# Patient Record
Sex: Female | Born: 1985
Health system: Southern US, Community
[De-identification: ages and names within clinical notes are randomized; demographics above are authoritative.]

## PROBLEM LIST (undated history)

## (undated) VITALS — BP 154/107 | HR 73 | Temp 98.5°F | Resp 18

## (undated) DIAGNOSIS — O24919 Unspecified diabetes mellitus in pregnancy, unspecified trimester: Secondary | ICD-10-CM

## (undated) DIAGNOSIS — G43909 Migraine, unspecified, not intractable, without status migrainosus: Secondary | ICD-10-CM

## (undated) DIAGNOSIS — F329 Major depressive disorder, single episode, unspecified: Secondary | ICD-10-CM

## (undated) DIAGNOSIS — IMO0002 Reserved for concepts with insufficient information to code with codable children: Secondary | ICD-10-CM

## (undated) DIAGNOSIS — B009 Herpesviral infection, unspecified: Secondary | ICD-10-CM

## (undated) DIAGNOSIS — F419 Anxiety disorder, unspecified: Secondary | ICD-10-CM

## (undated) DIAGNOSIS — F32A Depression, unspecified: Secondary | ICD-10-CM

## (undated) DIAGNOSIS — I493 Ventricular premature depolarization: Secondary | ICD-10-CM

## (undated) DIAGNOSIS — F99 Mental disorder, not otherwise specified: Secondary | ICD-10-CM

## (undated) HISTORY — DX: Unspecified diabetes mellitus in pregnancy, unspecified trimester: O24.919

## (undated) HISTORY — DX: Reserved for concepts with insufficient information to code with codable children: IMO0002

## (undated) HISTORY — DX: Anxiety disorder, unspecified: F41.9

## (undated) HISTORY — DX: Herpesviral infection, unspecified: B00.9

## (undated) HISTORY — PX: MOUTH SURGERY: SHX715

## (undated) HISTORY — PX: NO PAST SURGERIES: SHX2092

## (undated) HISTORY — DX: Depression, unspecified: F32.A

## (undated) HISTORY — DX: Mental disorder, not otherwise specified: F99

## (undated) HISTORY — DX: Major depressive disorder, single episode, unspecified: F32.9

## (undated) HISTORY — PX: CRYOTHERAPY: SHX1416

## (undated) HISTORY — DX: Migraine, unspecified, not intractable, without status migrainosus: G43.909

## (undated) HISTORY — DX: Ventricular premature depolarization: I49.3

---

## 2007-09-23 ENCOUNTER — Ambulatory Visit: Payer: Self-pay | Admitting: Obstetrics & Gynecology

## 2007-09-23 ENCOUNTER — Ambulatory Visit: Payer: Self-pay | Admitting: Family Medicine

## 2007-09-23 ENCOUNTER — Encounter: Payer: Self-pay | Admitting: Obstetrics & Gynecology

## 2007-09-23 DIAGNOSIS — F329 Major depressive disorder, single episode, unspecified: Secondary | ICD-10-CM | POA: Insufficient documentation

## 2007-09-23 DIAGNOSIS — F3289 Other specified depressive episodes: Secondary | ICD-10-CM | POA: Insufficient documentation

## 2007-09-23 DIAGNOSIS — J45909 Unspecified asthma, uncomplicated: Secondary | ICD-10-CM | POA: Insufficient documentation

## 2007-09-23 LAB — CONVERTED CEMR LAB
BUN: 15 mg/dL (ref 6–23)
CO2: 22 meq/L (ref 19–32)
Calcium: 9.4 mg/dL (ref 8.4–10.5)
Chloride: 107 meq/L (ref 96–112)
Cholesterol: 132 mg/dL (ref 0–200)
Creatinine, Ser: 0.69 mg/dL (ref 0.40–1.20)
Glucose, Bld: 91 mg/dL (ref 70–99)
HDL: 48 mg/dL (ref >39)
Total CHOL/HDL Ratio: 2.8

## 2007-09-24 ENCOUNTER — Encounter: Payer: Self-pay | Admitting: Family Medicine

## 2007-10-06 ENCOUNTER — Ambulatory Visit: Payer: Self-pay | Admitting: Family Medicine

## 2007-10-06 DIAGNOSIS — M62838 Other muscle spasm: Secondary | ICD-10-CM

## 2007-10-06 DIAGNOSIS — T753XXA Motion sickness, initial encounter: Secondary | ICD-10-CM

## 2007-10-15 ENCOUNTER — Encounter: Admission: RE | Admit: 2007-10-15 | Discharge: 2007-11-24 | Payer: Self-pay | Admitting: Family Medicine

## 2007-10-28 ENCOUNTER — Ambulatory Visit: Payer: Self-pay | Admitting: Obstetrics & Gynecology

## 2007-11-24 ENCOUNTER — Encounter: Payer: Self-pay | Admitting: Family Medicine

## 2008-01-28 ENCOUNTER — Encounter: Payer: Self-pay | Admitting: Family Medicine

## 2008-01-28 ENCOUNTER — Encounter: Admission: RE | Admit: 2008-01-28 | Discharge: 2008-01-28 | Payer: Self-pay | Admitting: Sports Medicine

## 2008-02-02 ENCOUNTER — Encounter: Admission: RE | Admit: 2008-02-02 | Discharge: 2008-03-09 | Payer: Self-pay | Admitting: Sports Medicine

## 2008-02-11 ENCOUNTER — Encounter: Payer: Self-pay | Admitting: Family Medicine

## 2008-02-13 ENCOUNTER — Encounter: Admission: RE | Admit: 2008-02-13 | Discharge: 2008-02-13 | Payer: Self-pay | Admitting: Sports Medicine

## 2008-04-26 ENCOUNTER — Telehealth (INDEPENDENT_AMBULATORY_CARE_PROVIDER_SITE_OTHER): Payer: Self-pay | Admitting: *Deleted

## 2008-04-27 ENCOUNTER — Ambulatory Visit: Payer: Self-pay | Admitting: Family Medicine

## 2008-04-27 DIAGNOSIS — M279 Disease of jaws, unspecified: Secondary | ICD-10-CM | POA: Insufficient documentation

## 2008-05-18 ENCOUNTER — Ambulatory Visit: Payer: Self-pay | Admitting: Obstetrics & Gynecology

## 2008-05-18 ENCOUNTER — Encounter: Payer: Self-pay | Admitting: Obstetrics & Gynecology

## 2008-06-24 ENCOUNTER — Ambulatory Visit: Payer: Self-pay | Admitting: Physician Assistant

## 2008-06-24 ENCOUNTER — Ambulatory Visit: Payer: Self-pay | Admitting: Occupational Medicine

## 2008-06-24 DIAGNOSIS — R109 Unspecified abdominal pain: Secondary | ICD-10-CM

## 2008-06-24 LAB — CONVERTED CEMR LAB
Bilirubin Urine: NEGATIVE
Blood in Urine, dipstick: NEGATIVE
Chlamydia, DNA Probe: NEGATIVE
GC Probe Amp, Genital: NEGATIVE
Ketones, urine, test strip: NEGATIVE
Nitrite: NEGATIVE
WBC Urine, dipstick: NEGATIVE

## 2008-06-27 ENCOUNTER — Ambulatory Visit: Payer: Self-pay | Admitting: Obstetrics and Gynecology

## 2008-09-13 ENCOUNTER — Telehealth: Payer: Self-pay | Admitting: Family Medicine

## 2008-09-13 ENCOUNTER — Ambulatory Visit: Payer: Self-pay | Admitting: Family Medicine

## 2008-09-23 ENCOUNTER — Telehealth: Payer: Self-pay | Admitting: Family Medicine

## 2008-12-21 ENCOUNTER — Ambulatory Visit: Payer: Self-pay | Admitting: Obstetrics & Gynecology

## 2008-12-21 ENCOUNTER — Encounter: Payer: Self-pay | Admitting: Obstetrics & Gynecology

## 2009-01-03 DIAGNOSIS — IMO0002 Reserved for concepts with insufficient information to code with codable children: Secondary | ICD-10-CM

## 2009-01-03 DIAGNOSIS — R87619 Unspecified abnormal cytological findings in specimens from cervix uteri: Secondary | ICD-10-CM

## 2009-01-03 HISTORY — DX: Reserved for concepts with insufficient information to code with codable children: IMO0002

## 2009-01-03 HISTORY — DX: Unspecified abnormal cytological findings in specimens from cervix uteri: R87.619

## 2009-01-11 ENCOUNTER — Ambulatory Visit: Payer: Self-pay | Admitting: Family Medicine

## 2009-01-11 ENCOUNTER — Ambulatory Visit: Payer: Self-pay | Admitting: Obstetrics & Gynecology

## 2009-01-12 LAB — CONVERTED CEMR LAB
ALT: 12 units/L (ref 0–35)
Albumin: 4.5 g/dL (ref 3.5–5.2)
CO2: 24 meq/L (ref 19–32)
Calcium: 9.1 mg/dL (ref 8.4–10.5)
Chloride: 104 meq/L (ref 96–112)
Cholesterol: 150 mg/dL (ref 0–200)
Creatinine, Ser: 0.69 mg/dL (ref 0.40–1.20)
HCT: 41.3 % (ref 36.0–46.0)
Platelets: 278 10*3/uL (ref 150–400)
Potassium: 4.4 meq/L (ref 3.5–5.3)
TSH: 1.22 microintl units/mL (ref 0.350–4.500)
Total CHOL/HDL Ratio: 2.5
Total Protein: 7.6 g/dL (ref 6.0–8.3)
WBC: 9.1 10*3/uL (ref 4.0–10.5)

## 2009-02-15 ENCOUNTER — Ambulatory Visit: Payer: Self-pay | Admitting: Obstetrics and Gynecology

## 2009-02-20 ENCOUNTER — Ambulatory Visit: Payer: Self-pay | Admitting: Family

## 2009-02-20 LAB — CONVERTED CEMR LAB
Chlamydia, DNA Probe: NEGATIVE
GC Probe Amp, Genital: NEGATIVE
HCV Ab: NEGATIVE
Hepatitis B Surface Ag: NEGATIVE

## 2010-03-07 ENCOUNTER — Emergency Department (HOSPITAL_BASED_OUTPATIENT_CLINIC_OR_DEPARTMENT_OTHER): Admission: EM | Admit: 2010-03-07 | Discharge: 2010-03-07 | Payer: Self-pay | Admitting: Physician Assistant

## 2010-03-20 ENCOUNTER — Ambulatory Visit: Payer: Self-pay | Admitting: Obstetrics & Gynecology

## 2010-03-20 ENCOUNTER — Ambulatory Visit: Payer: Self-pay | Admitting: Family Medicine

## 2010-03-21 ENCOUNTER — Encounter: Payer: Self-pay | Admitting: Obstetrics & Gynecology

## 2010-03-21 LAB — CONVERTED CEMR LAB
HCV Ab: NEGATIVE
Trich, Wet Prep: NONE SEEN
Yeast Wet Prep HPF POC: NONE SEEN

## 2010-03-29 ENCOUNTER — Encounter: Payer: Self-pay | Admitting: Family Medicine

## 2010-04-10 ENCOUNTER — Telehealth: Payer: Self-pay | Admitting: Family Medicine

## 2010-04-26 ENCOUNTER — Encounter: Payer: Self-pay | Admitting: Family Medicine

## 2010-07-19 ENCOUNTER — Encounter: Payer: Self-pay | Admitting: Family Medicine

## 2010-07-20 ENCOUNTER — Encounter: Payer: Self-pay | Admitting: Family

## 2010-07-20 ENCOUNTER — Encounter: Payer: Self-pay | Admitting: Obstetrics & Gynecology

## 2010-07-20 ENCOUNTER — Ambulatory Visit: Payer: Self-pay | Admitting: Family

## 2010-07-20 LAB — CONVERTED CEMR LAB
Chlamydia, DNA Probe: NEGATIVE
GC Probe Amp, Genital: NEGATIVE

## 2010-08-27 ENCOUNTER — Encounter: Payer: Self-pay | Admitting: Family Medicine

## 2010-08-28 ENCOUNTER — Ambulatory Visit
Admission: RE | Admit: 2010-08-28 | Discharge: 2010-08-28 | Payer: Self-pay | Source: Home / Self Care | Attending: Family Medicine | Admitting: Family Medicine

## 2010-08-28 ENCOUNTER — Encounter: Payer: Self-pay | Admitting: Family Medicine

## 2010-08-28 DIAGNOSIS — J189 Pneumonia, unspecified organism: Secondary | ICD-10-CM | POA: Insufficient documentation

## 2010-09-04 NOTE — Letter (Signed)
Summary: [facility]  [facility]   Imported By: Lanelle Bal 05/25/2010 15:17:43  _____________________________________________________________________  External Attachment:    Type:   Image     Comment:   External Document

## 2010-09-04 NOTE — Assessment & Plan Note (Signed)
Summary: CPE   Vital Signs:  Patient profile:   25 year old female Height:      62.25 inches Weight:      151 pounds BMI:     27.50 Pulse rate:   87 / minute BP sitting:   118 / 77  (left arm)  Vitals Entered By: Avon Gully CMA, Duncan Dull) (March 20, 2010 1:36 PM) CC: CPE no pap   Primary Care Provider:  Linford Arnold, C  CC:  CPE no pap.  History of Present Illness: CPE no pap.  No psecific complaints today. She is seeing Dr. Margaretha Sheffield for her back pain.    Current Medications (verified): 1)  Multivitamins   Tabs (Multiple Vitamin) .... Take 1 Tablet By Mouth Once A Day 2)  Proair Hfa 108 (90 Base) Mcg/act  Aers (Albuterol Sulfate) .... 2 Puffs Inh As Needed 3)  Mirena 20 Mcg/24hr Iud (Levonorgestrel) .Marland Kitchen.. 1 Tab By Mouth Once Daily 4)  Flovent Hfa 220 Mcg/act Aero (Fluticasone Propionate  Hfa) .... 2 Puff Inhaled Two Times A Day 5)  Nebulizer With Tubing. .... Dx of Asthma. 6)  Albuterol Sulfate (2.5 Mg/58ml) 0.083% Nebu (Albuterol Sulfate) .... 3 Ml in Nebulizer Machine Q 4 Hrs Prn  Allergies (verified): 1)  ! Pcn  Comments:  Nurse/Medical Assistant: The patient's medications and allergies were reviewed with the patient and were updated in the Medication and Allergy Lists. Avon Gully CMA, Duncan Dull) (March 20, 2010 1:37 PM)  Past History:  Past Medical History: Last updated: 09/23/2007 Gestational DM Gyn- [facility].   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  Separated from TimGentry with one child, daughter.  Never Smoked Alcohol use-yes Drug use-no Regular exercise-yes  Review of Systems  The patient denies anorexia, fever, weight loss, weight gain, vision loss, decreased hearing, chest pain, syncope, dyspnea on exertion, peripheral edema, prolonged  cough, headaches, hemoptysis, abdominal pain, melena, hematochezia, severe indigestion/heartburn, hematuria, incontinence, genital sores, muscle weakness, suspicious skin lesions, transient blindness, difficulty walking, depression, unusual weight change, abnormal bleeding, enlarged lymph nodes, and breast masses.    Physical Exam  General:  Well-developed,well-nourished,in no acute distress; alert,appropriate and cooperative throughout examination Head:  Normocephalic and atraumatic without obvious abnormalities. No apparent alopecia or balding. Eyes:  No corneal or conjunctival inflammation noted. EOMI. Perrla.  Ears:  External ear exam shows no significant lesions or deformities.  Otoscopic examination reveals clear canals, tympanic membranes are intact bilaterally without bulging, retraction, inflammation or discharge. Hearing is grossly normal bilaterally. Nose:  External nasal examination shows no deformity or inflammation. Nasal mucosa are pink and moist without lesions or exudates. Mouth:  Oral mucosa and oropharynx without lesions or exudates.  Teeth in good repair. Neck:  No deformities, masses, or tenderness noted. Chest Wall:  No deformities, masses, or tenderness noted. Breasts:  No mass, nodules, thickening, tenderness, bulging, retraction, inflamation, nipple discharge or skin changes noted.   Lungs:  Normal respiratory effort, chest expands symmetrically. Lungs are clear to auscultation, no crackles or wheezes. Heart:  Normal rate and regular rhythm. S1 and S2 normal without gallop, murmur, click, rub or other extra sounds. Abdomen:  Bowel sounds positive,abdomen soft and non-tender without masses, organomegaly or hernias noted. Msk:  No deformity or scoliosis noted of thoracic or lumbar spine.   Pulses:  R and  L carotid,radialdorsalis pedis and posterior tibial pulses are full and equal bilaterally Extremities:  No clubbing, cyanosis, edema, or deformity noted with normal full  range of motion of all joints.   Neurologic:  No cranial nerve deficits noted. Station and gait are normal. DTRs are symmetrical throughout. Sensory, motor and coordinative functions appear intact. Skin:  no rashes.   Cervical Nodes:  No lymphadenopathy noted Psych:  Cognition and judgment appear intact. Alert and cooperative with normal attention span and concentration. No apparent delusions, illusions, hallucinations   Impression & Recommendations:  Problem # 1:  HEALTH MAINTENANCE EXAM (ICD-V70.0) Will get screening labs Doing well.  Orders: T-Comprehensive Metabolic Panel [id number]) T-Lipid Profile [id number])  Complete Medication List: 1)  Multivitamins Tabs (Multiple vitamin) .... Take 1 tablet by mouth once a day 2)  Proair Hfa 108 (90 Base) Mcg/act Aers (Albuterol sulfate) .... 2 puffs inh as needed 3)  Mirena 20 Mcg/24hr Iud (Levonorgestrel) .Marland Kitchen.. 1 tab by mouth once daily 4)  Flovent Hfa 220 Mcg/act Aero (Fluticasone propionate  hfa) .... 2 puff inhaled two times a day 5)  Nebulizer With Tubing.  .... Dx of asthma. 6)  Albuterol Sulfate (2.5 Mg/69ml) 0.083% Nebu (Albuterol sulfate) .... 3 ml in nebulizer machine q 4 hrs prn  Patient Instructions: 1)  We will call you with your lab results.  2)  Keep up the good work with the exercise.  3)  Will start effexor and see you back in one month.  Prescriptions: ALBUTEROL SULFATE (2.5 MG/3ML) 0.083% NEBU (ALBUTEROL SULFATE) 3 ml in nebulizer machine q 4 hrs prn  #2 boxes x 1   Entered and Authorized by:   Nani Gasser MD   Signed by:   Nani Gasser MD on 03/20/2010   Method used:   Electronically to        [facility]* (retail)       [address] [place], [place]  [address]       Ph: [phone]       Fax: [phone]   RxID:   [id number] FLOVENT HFA 220 MCG/ACT AERO (FLUTICASONE PROPIONATE  HFA) 2 puff inhaled two times a day  #1 x 5   Entered and Authorized by:    Nani Gasser MD   Signed by:   Nani Gasser MD on 03/20/2010   Method used:   Electronically to        [facility]* (retail)       [address] [place], [place]  [address]       Ph: [phone]       Fax: [phone]   RxID:   [id number] PROAIR HFA 108 (90 BASE) MCG/ACT  AERS (ALBUTEROL SULFATE) 2 puffs INH as needed  #1 x 2   Entered and Authorized by:   Nani Gasser MD   Signed by:   Nani Gasser MD on 03/20/2010   Method used:   Electronically to        [facility]* (retail)       [address] [place], [place]  [address]       Ph: [phone]       Fax: [phone]   RxID:   [id number]

## 2010-09-04 NOTE — Progress Notes (Signed)
Summary: med  Phone Note Call from Patient   Caller: Patient Call For: Nani Gasser MD Summary of Call: Pt called and states when she was last here she was supposed to get a rx for effexor but it was not at the [facility] in [place]. pt wants a rx sent to [facility] on [address] Initial call taken by: Avon Gully CMA, Duncan Dull),  April 10, 2010 10:47 AM  Follow-up for Phone Call        Rx sent.  Follow-up by: Nani Gasser MD,  April 10, 2010 10:52 AM    New/Updated Medications: VENLAFAXINE HCL 37.5 MG XR24H-CAP (VENLAFAXINE HCL) Take 1 tablet by mouth once a day Prescriptions: VENLAFAXINE HCL 37.5 MG XR24H-CAP (VENLAFAXINE HCL) Take 1 tablet by mouth once a day  #30 x 0   Entered and Authorized by:   Nani Gasser MD   Signed by:   Nani Gasser MD on 04/10/2010   Method used:   Electronically to        [facility] [phone]* (retail)       [address]       [place], [place]  [address]       Ph: [phone]       Fax: [phone]   RxID:   [id number]

## 2010-09-04 NOTE — Letter (Signed)
Summary: [facility]  [facility]   Imported By: Lanelle Bal 04/19/2010 11:37:24  _____________________________________________________________________  External Attachment:    Type:   Image     Comment:   External Document

## 2010-09-06 NOTE — Letter (Signed)
Summary: [facility]  [facility]   Imported By: Lanelle Bal 08/29/2010 10:10:21  _____________________________________________________________________  External Attachment:    Type:   Image     Comment:   External Document

## 2010-09-06 NOTE — Assessment & Plan Note (Signed)
Summary: Pneumonia   Vital Signs:  Patient profile:   25 year old female Height:      62.25 inches Weight:      165 pounds Pulse rate:   69 / minute BP sitting:   136 / 81  (right arm) Cuff size:   regular  Vitals Entered By: Avon Gully CMA, Duncan Dull) (August 28, 2010 4:18 PM) CC: hosp f/u   Primary Care Provider:  Linford Arnold, C  CC:  hosp f/u.  History of Present Illness: 4 days ago felt some tightness in her chest and thought it was her asthma. Used her alubertol but it didnb't really seem to help. Has been very fatigued for over a week. Sleeping alot. Then woke up yesteray and felt worse. Went to [facility].  Given CXR and told had early PNA. Given reocephin shot and given rx for zithromax.  New rx for the albuterol given as well. Note she had been out of her flovent because she couldn't afford it. Was over $100. No fever or sinus sxs.   We called to get records but were unable to get any before she left.  Also she did bring in her CXR report which they printed for her. CXr report showed mild righ lower lobe infiltrates suggesting early penumonia.    Current Medications (verified): 1)  Multivitamins   Tabs (Multiple Vitamin) .... Take 1 Tablet By Mouth Once A Day 2)  Proair Hfa 108 (90 Base) Mcg/act  Aers (Albuterol Sulfate) .... 2 Puffs Inh As Needed 3)  Mirena 20 Mcg/24hr Iud (Levonorgestrel) .Marland Kitchen.. 1 Tab By Mouth Once Daily 4)  Flovent Hfa 220 Mcg/act Aero (Fluticasone Propionate  Hfa) .... 2 Puff Inhaled Two Times A Day 5)  Nebulizer With Tubing. .... Dx of Asthma. 6)  Albuterol Sulfate (2.5 Mg/69ml) 0.083% Nebu (Albuterol Sulfate) .... 3 Ml in Nebulizer Machine Q 4 Hrs Prn 7)  Amitriptyline Hcl 75 Mg Tabs (Amitriptyline Hcl) 8)  Soma 350 Mg Tabs (Carisoprodol) 9)  Norco 10-325 Mg Tabs (Hydrocodone-Acetaminophen)  Allergies (verified): 1)  ! Pcn  Comments:  Nurse/Medical Assistant: The patient's medications and allergies were reviewed with the patient and were updated in  the Medication and Allergy Lists. 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  Separated from TimGentry with one child, daughter.  Never Smoked Alcohol use-yes Drug use-no Regular exercise-yes  Physical Exam  General:  Well-developed,well-nourished,in no acute distress; alert,appropriate and cooperative throughout examination Head:  Normocephalic and atraumatic without obvious abnormalities. No apparent alopecia or balding. Eyes:  No corneal or conjunctival inflammation noted. EOMI. Perrla.  Ears:  External ear exam shows no significant lesions or deformities.  Otoscopic examination reveals clear canals, tympanic membranes are intact bilaterally without bulging, retraction, inflammation or discharge. Hearing is grossly normal bilaterally. Nose:  External nasal examination shows no deformity or inflammation. Nasal mucosa are pink and moist without lesions or exudates. Mouth:  Oral mucosa and oropharynx without lesions or exudates.  Teeth in good repair. Neck:  No deformities, masses, or tenderness noted. Lungs:  Normal respiratory effort, chest expands symmetrically. Lungs are clear to auscultation, no crackles or wheezes. Heart:  Normal rate and regular rhythm. S1 and S2 normal without gallop, murmur, click, rub or other extra sounds. Skin:  no rashes.   Cervical Nodes:  No lymphadenopathy noted Psych:  Cognition and judgment appear intact. Alert and cooperative with normal attention span and  concentration. No apparent delusions, illusions, hallucinations   Impression & Recommendations:  Problem # 1:  PNEUMONIA (ICD-486) Asked her to fill her ABX today and gets started as soon as possible.  Gave her samples of QVAR 80mg  one puff two times a day. Follow up in 2 weeks to make sure she is better and sxs have resolved.  Held off on home oral steroids.   Call if  suddenly gets  Wrok note given.   Complete Medication List: 1)  Multivitamins Tabs (Multiple vitamin) .... Take 1 tablet by mouth once a day 2)  Proair Hfa 108 (90 Base) Mcg/act Aers (Albuterol sulfate) .... 2 puffs inh as needed 3)  Mirena 20 Mcg/24hr Iud (Levonorgestrel) .Marland Kitchen.. 1 tab by mouth once daily 4)  Flovent Hfa 220 Mcg/act Aero (Fluticasone propionate  hfa) .... 2 puff inhaled two times a day 5)  Nebulizer With Tubing.  .... Dx of asthma. 6)  Albuterol Sulfate (2.5 Mg/1ml) 0.083% Nebu (Albuterol sulfate) .... 3 ml in nebulizer machine q 4 hrs prn 7)  Amitriptyline Hcl 75 Mg Tabs (Amitriptyline hcl) 8)  Soma 350 Mg Tabs (Carisoprodol) 9)  Norco 10-325 Mg Tabs (Hydrocodone-acetaminophen)   Orders Added: 1)  Est. Patient Level IV [41660]

## 2010-09-06 NOTE — Letter (Signed)
Summary: Out of Work  [facility]  [address]   [place], [place] [address]   Phone: [phone]  Fax: [phone]    August 28, 2010   Employee:  [employer]AK[employer]    To Whom It May Concern:   For Medical reasons, please excuse the above named employee from work for the following dates:  Start:   08-27-2010  Return:   08-30-2010  If you need additional information, please feel free to contact our office.         Sincerely,    Nani Gasser MD

## 2010-09-13 ENCOUNTER — Encounter: Payer: Self-pay | Admitting: Family Medicine

## 2010-10-23 NOTE — Letter (Signed)
Summary: [facility]  [facility]   Imported By: Maryln Gottron 10/16/2010 15:58:48  _____________________________________________________________________  External Attachment:    Type:   Image     Comment:   External Document

## 2010-11-06 ENCOUNTER — Other Ambulatory Visit: Payer: Self-pay | Admitting: Obstetrics & Gynecology

## 2010-11-11 LAB — POCT PREGNANCY, URINE: Preg Test, Ur: NEGATIVE

## 2010-11-13 ENCOUNTER — Other Ambulatory Visit: Payer: Self-pay | Admitting: Obstetrics & Gynecology

## 2010-11-13 ENCOUNTER — Other Ambulatory Visit: Payer: BLUE CROSS/BLUE SHIELD | Admitting: Obstetrics & Gynecology

## 2010-11-13 DIAGNOSIS — D069 Carcinoma in situ of cervix, unspecified: Secondary | ICD-10-CM

## 2010-12-18 NOTE — Assessment & Plan Note (Signed)
Shirley Ruiz, Shirley Ruiz               ACCOUNT NO.:  [id number]   MEDICAL RECORD NO.:  [id number]          PATIENT TYPE:  POB   LOCATION:  [facility] [place]         FACILITY:  [facility]   PHYSICIAN:  Caren Griffins, CNM       DATE OF BIRTH:  [birth year]   DATE OF SERVICE:  06/27/2008                                  CLINIC NOTE   REASON FOR VISIT:  Recheck of lower abdominal pain.   HISTORY:  This 25 year old G1, P1, whose periods are irregular and scant  on Mirena.  She was seen here 4 days ago; however, the office record is  not available.  She describes having severe suprapubic pubic abdominal  pain of short duration.  She also had had been having some diarrhea for  a couple of days prior to that visit.  She denies purulent vaginal  discharge, dysuria, bowel problems, or in fact any abdominal pain since  we last saw her.  She did start on Vicoprofen, azithromycin, and doxy.  She is still taking the doxy and is not taking the others.  She denies  dyspareunia, but has not had intercourse with deep penetration since her  pain began about a week ago.  She is happy with her Mirena and she is  aware that she is due for a Pap in 6 months which would be in April due  to having LSIL on previous Pap.   ASSESSMENT:  Pelvic pain, possibly pelvic inflammatory disease, resolved  on treatment.   PLAN:  The patient is reassured that her gonorrhea and chlamydia  cultures were negative.  Her urinalysis was essentially negative with a  pH of 8.5 and 30 of protein, however.  She is advised to call us if she  has any further episodes of severe abdominal pain and also to note its  association with activity and intercourse, bowel and bladder function.           ______________________________  Caren Griffins, CNM     DP/MEDQ  D:  06/27/2008  T:  06/28/2008  Job:  [id number]

## 2010-12-18 NOTE — Assessment & Plan Note (Signed)
NAMEJONAYA, FRESHOUR NO.:  [id number]   MEDICAL RECORD NO.:  [id number]          PATIENT TYPE:  POB   LOCATION:  [facility] [place]         FACILITY:  [facility]   PHYSICIAN:  Elsie Lincoln, MD      DATE OF BIRTH:  07/27/1986   DATE OF SERVICE:  12/21/2008                                  CLINIC NOTE   The patient is a 25 year old female who presents for Pap smear.  The  patient has a history of 2 low-grades with the colpo showing CIN-1.  Also of note, the patient is recently separated from her husband.  She  has not had sex with anyone else; however, she is considering entering  into a relationship.  She has good contraception with Mirena, but she is  in need of Gardasil vaccination, and we explained the differences in the  high-risk viruses and that we cannot tell which one she has been  affected by; however, it could protect her from 16, 18 as well as the  wart viruses.  The patient agrees to have the Gardasil shot, and we will  start that today.  The patient has no other complaints.   PHYSICAL EXAMINATION:  ABDOMEN:  Soft, nontender.  GENITALIA:  Tanner V.  Vagina pink, normal rugae.  Cervix closed with  IUD string approximately 3 cm out of the os.   ASSESSMENT AND PLAN:  A 25 year old female with low-grade squamous  intraepithelial lesion Pap smear.  1. Re-Pap today.  If the Pap smear shows persistent low grade, I would      like to repeat the colposcopy and make sure that there are no      advancement.  2. Gardasil vaccination at 0, 2, and 6 months.  3. Return to clinic for the Gardasil or sooner for colpo if necessary.           ______________________________  Elsie Lincoln, MD     KL/MEDQ  D:  12/21/2008  T:  12/22/2008  Job:  [id number]

## 2010-12-18 NOTE — Assessment & Plan Note (Signed)
NAMEMYRACLE, FEBRES NO.:  [id number]   MEDICAL RECORD NO.:  [id number]          PATIENT TYPE:  POB   LOCATION:  [facility] [place]         FACILITY:  [facility]   PHYSICIAN:  Elsie Lincoln, MD      DATE OF BIRTH:  01-18-1986   DATE OF SERVICE:                                  CLINIC NOTE   The patient is a 25 year old G1, P27 female LMP 08/07/2007.  The patient  uses Purnell Shoemaker for contraception and is happy with it. The patient is here  for her early exam, however, she did have a full physical by family  practice earlier today. They did not do the breast and Pap smear, which  I will do myself. She has no complaints of an obstetrical or  gynecological nature. She is thinking about conceiving in the next year.  If that is the case, she should start prenatal vitamins one month prior  to starting to conceive and come for an IUD removal.   PAST MEDICAL HISTORY:  Depression, asthma, stress eating and gestational  diabetes.   PAST SURGICAL HISTORY:  None.   GYN HISTORY:  Had normal Pap smears approximately four years ago and all  was done with repeat Pap smears, which were normal. Never had any  ovarian cysts, fibroid tumors or sexually transmitted diseases. She is  sexually active with her husband with no complaints.   FAMILY HISTORY:  Is positive for heart disease in her  mother and mother  has high blood pressure.  Her grandmother and mother also have chronic  depression.   SOCIAL HISTORY:  She lives with her husband and daughter. She does work  outside the home. She does not smoke. She does drink approximately one  to two alcoholic drinks per month and she does drink one caffeinated  beverage a day.   SYSTEMS REVIEW:  Is positive for muscle aches and fatigue. 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   PHYSICAL EXAMINATION:  VITAL SIGNS: Pulse 88. Blood pressure 121/81.  Weight 135. Height 62  and a fourth inches.  GENERAL: Well-nourished and well-developed in no apparent distress.  HEENT: Normocephalic atraumatic.  BREASTS: No masses, nontender, no lymphadenopathy.  ABDOMEN: Soft, nontender. No organomegaly. No hernia.  GENITALIA: Tanner five. Vaginal pink. Normal rugae. Cervix closed,  nontender. The uterus anteverted, nontender. Adnexa no masses,  nontender.. The urethra and bladder nontender. No evidence of rectocele  or cystocele.  EXTREMITIES: Nontender.   ASSESSMENT/PLAN:  25 year old female for a Pap and pelvic today.  1. Pap smear done.  2. GC and Chlamydia cultures sent.  3. Previous sexual counseling done.  4. Return to clinic in a year or sooner as needed.           ______________________________  Elsie Lincoln, MD     KL/MEDQ  D:  09/23/2007  T:  09/24/2007  Job:  [id number]

## 2010-12-18 NOTE — Assessment & Plan Note (Signed)
NAMEJAKEIA, CARRERAS               ACCOUNT NO.:  [id number]   MEDICAL RECORD NO.:  [id number]          PATIENT TYPE:  POB   LOCATION:  [facility] [place]         FACILITY:  [facility]   PHYSICIAN:  Sid Falcon, CNM  DATE OF BIRTH:  [birth year]   DATE OF SERVICE:  02/20/2009                                  CLINIC NOTE   REASON FOR VISIT:  Second dose of Gardasil and STD screening.   The patient is here with reports of separated from husband who she  believes is having sex with other woman.  Denies any abnormal discharge  or complaints; however, would like screening before becoming sexually  active again.   PHYSICAL EXAMINATION:  Cervix healing cryo sites, white clear discharge  seen.  Yellow mucus seen around the cryo sites.  IUD strings visualized.  Negative cervical motion tenderness.   ASSESSMENT:  1. Sexually transmitted disease screening.  2. Gardasil vaccination.   PLAN:  Gardasil second dose today.   LABORATORY DATA:  Hepatitis B and hepatitis C, HIV screen, and RPR with  Chlamydia and gonorrhea obtained during the pelvic exam.  The patient is  to follow up in 6 months after the cryo for repeat Pap.      Sid Falcon, CNM     WM/MEDQ  D:  02/20/2009  T:  02/21/2009  Job:  [id number]

## 2010-12-18 NOTE — Assessment & Plan Note (Signed)
NAMEKAMRIE, FANTON               ACCOUNT NO.:  [id number]   MEDICAL RECORD NO.:  [id number]          PATIENT TYPE:  POB   LOCATION:  [facility] [place]         [facility]ACILITY[facility]   PHYSICIAN:  Sid Falcon, CNM  DATE OF BIRTH:  1986-07-14   DATE OF SERVICE:  07/20/2010                                  CLINIC NOTE   REASON FOR VISIT:  Vaginal irritation.   The patient is reporting some odor and pain with urinating for the past  week, concerned because partner had sex with some other 2 months ago  after her last visit with [place].  No fever, body aches, or chills.   PHYSICAL EXAMINATION:  PELVIC:  No abnormal lesions.  White frothy  discharge noted in the vaginal wall.  No odor.  No abnormal lesions.  Negative cervical motion tenderness.  Cervix with no abnormal lesions.  No abnormal discharge coming from os.   ASSESSMENT:  1. Vaginal discharge.   PLAN:  Lab, wet prep, and GC/CT sent to lab.  Prescription for  metronidazole 500 mg 1 p.o. b.i.d. x7 days, use only if call with a  positive result.  The patient reminded about her repeat Pap smear due in  February 2012.      Sid Falcon, CNM     WM/MEDQ  D:  07/20/2010  T:  07/21/2010  Job:  [id number]

## 2010-12-18 NOTE — Assessment & Plan Note (Signed)
NAMEVELVA, MOLINARI               ACCOUNT NO.:  [id number]   MEDICAL RECORD NO.:  [id number]          PATIENT TYPE:  POB   LOCATION:  [facility] at [place]         FACILITY:  [facility]   PHYSICIAN:  Maylon Cos, CNM    DATE OF BIRTH:  Feb 03, 1986   DATE OF SERVICE:  06/24/2008                                  CLINIC NOTE   The patient presented at this visit for a complaint of suprapubic pain  x3 days.  The patient has a Mirena IUD and has had one for approximately  2-1/2 years.  She states that 3 days ago she started having some  suprapubic pressure and pain that was constant and excruciating and that  she had had an episode of diarrhea approximately 2 days ago after the  pain started.  During this course of time she has been afebrile but has  run a low-grade temperature.  The last temperature recorded the evening  of the night before her visit, being 99.2.  She states that the pain is  not well relieved by Tylenol or ibuprofen.  She has had no unusual  vaginal discharge, no vomiting, no chills.  Her vital signs today are  stable.   On examination today Nicki is a well-nourished 25 year old female who  appears to be her stated age.  She is in no apparent distress.  Her exam  today is problem focused.  ABDOMINAL EXAM:  Her abdomen is nontender.  She has positive bowel  sounds in 4 quadrants.  There are no masses noted.  On genitalia exam  she is a Tanner 5.  There are no lesions externally.  Mucous membranes  are pink without lesion.  Her cervix is parous and her IUD strings are  noted at the cervical os.  There is a scant amount of creamy vaginal  discharge noted in the vault.  However, there is no purulent discharge  noted coming from the cervical os and her cervix is nonfriable.  Cultures and wet preps are obtained.  On bimanual examination there is  mild cervical motion tenderness but no chandelier sign.  Her uterus is  also nonenlarged but slightly tender.  Adnexa are nonenlarged  and  nontender.   IMPRESSION:  Abdominal pain suspicious for mild pelvic inflammatory  disease.   IMPRESSION:  Gonorrhea, chlamydia cultures were obtained and a urine  culture was sent.  The patient was started on doxycycline 100 mg daily  and instructed to follow up in 48 hours for recheck.  She was also given  pain medication of Vicoprofen with instructions of 1 every 6 hours  p.r.n. as needed and the patient was given my pager number in case the  pain became more excruciating and hospital evaluation was needed.  Overall, the patient is very stable and appropriate for outpatient  management if this is a mild PID.           ______________________________  Maylon Cos, CNM     SS/MEDQ  D:  09/16/2008  T:  09/16/2008  Job:  [id number]

## 2010-12-18 NOTE — Assessment & Plan Note (Signed)
NAME:  Shirley Ruiz, Shirley Ruiz NO.:  [id number]   MEDICAL RECORD NO.:  [id number]          PATIENT TYPE:  POB   LOCATION:  [facility] at [place]         FACILITY:  [facility]   PHYSICIAN:  Elsie Lincoln, MD      DATE OF BIRTH:  [birth year]   DATE OF SERVICE:  03/20/2010                                  CLINIC NOTE   The patient is a 25 year old para 1 female who presents for yearly exam.  The patient has a history of CIN II status post cryo a year ago.  She  missed her first 94-month followup due to lack of insurance coverage.  She also missed Gardasil #3, but she is getting today.  The patient is  in a new relationship and would like STD testing.  She has no new  medical problems.  She has new onset back pain and sees Dr. Margaretha Sheffield and  Dr. Linford Arnold for this.  She has a Mirena IUD in place and has irregular  spotting, but no menstrual cycle.  She is happy with the Mirena.  It is  due to come out in a year.   PAST MEDICAL HISTORY:  History of depression, asthma, and gestational  diabetes.   PAST SURGICAL HISTORY:  None.   OBSTETRICAL HISTORY:  NSVD x1.   GYNECOLOGIC HISTORY:  CIN II requiring cryo, IUD in situ.  The patient  has a history of probable pelvic inflammatory disease and STDs in the  past, none currently hopefully.   FAMILY HISTORY:  Chronic depression in a grandmother and mother.  High  blood pressure in her mother and heart disease in her mother.   MEDICATIONS:  Albuterol, multivitamin, Flovent and she will be starting  on Effexor as her doctor just called this in for her.   SOCIAL HISTORY:  The patient does not smoke or do drugs.  She drinks  alcohol socially.  She has never been a victim of sexual or physical  abuse.   Systemic review is positive for back pain.   PHYSICAL EXAMINATION:  VITAL SIGNS:  Pulse 64, blood pressure 129/85,  weight 149, height 62 inches.  GENERAL:  Well nourished, well developed, no apparent distress.  HEENT:  Normocephalic,  atraumatic.  NECK:  Thyroid, no masses.  LUNGS:  Clear to auscultation bilaterally.  HEART:  Regular rate and rhythm.  BREASTS:  No masses.  No nipple discharge.  The patient does have 2  hickeys on her breast.  No lymphadenopathy.  ABDOMEN:  Soft, nontender.  No organomegaly.  No hernia.  PELVIC:  Genitalia; shaven, Tanner V.  Vagina pink.  Normal rugae.  Cervix is closed, nontender.  Uterus; anteverted, nontender.  Adnexa; no  masses, nontender.  No cystocele.  No rectocele.  EXTREMITIES:  No edema.   ASSESSMENT AND PLAN:  A 25 year old female for well-woman exam.  1. Pap smear.  The patient needs a Pap smear in 6 month because she      has had cervical intraepithelial neoplasia II.  2. The patient needs third shot of Gardasil.  3. Wet prep, GC, Chlamydia, hep B, hep C, RPR and HIV testing.  4. Return to clinic in 6 months  ______________________________  Elsie Lincoln, MD     KL/MEDQ  D:  03/20/2010  T:  03/21/2010  Job:  [id number]

## 2011-01-16 ENCOUNTER — Other Ambulatory Visit: Payer: Self-pay | Admitting: Specialist

## 2011-01-16 DIAGNOSIS — M5412 Radiculopathy, cervical region: Secondary | ICD-10-CM

## 2011-01-26 ENCOUNTER — Ambulatory Visit
Admission: RE | Admit: 2011-01-26 | Discharge: 2011-01-26 | Disposition: A | Discharge status: Home / Self Care | Payer: BLUE CROSS/BLUE SHIELD | Source: Ambulatory Visit | Attending: Specialist | Admitting: Specialist

## 2011-01-26 ENCOUNTER — Other Ambulatory Visit: Payer: BLUE CROSS/BLUE SHIELD

## 2011-01-26 DIAGNOSIS — M5412 Radiculopathy, cervical region: Secondary | ICD-10-CM

## 2011-02-13 ENCOUNTER — Other Ambulatory Visit: Payer: Self-pay | Admitting: Specialist

## 2011-02-13 DIAGNOSIS — M5410 Radiculopathy, site unspecified: Secondary | ICD-10-CM

## 2011-02-18 ENCOUNTER — Ambulatory Visit (INDEPENDENT_AMBULATORY_CARE_PROVIDER_SITE_OTHER): Payer: BLUE CROSS/BLUE SHIELD | Admitting: Family Medicine

## 2011-02-18 ENCOUNTER — Encounter: Payer: Self-pay | Admitting: Family Medicine

## 2011-02-18 VITALS — BP 135/92 | HR 104 | Ht 62.0 in | Wt 164.0 lb

## 2011-02-18 DIAGNOSIS — J189 Pneumonia, unspecified organism: Secondary | ICD-10-CM

## 2011-02-18 NOTE — Progress Notes (Signed)
  Subjective:    Patient ID: Shirley Ruiz, female    DOB: 1986/06/09, 25 y.o.   MRN: [id number]  HPI Started with Right ST on Friday night. Then next day bilat ST, then felt achey, chills, maybe fever?Marland Kitchen Took Tylenol and fever was still 102.4. Then fever to 102.7 so took IBU and then fever kept rising.  Then went to [facility].  Also has a pressure in her chest.  Used her inhaler.  Has PNA earlier this year.  Told had a early left sided PNA. Given rocephin IM.  D-dimer also elevated adn then had a normal a chest CT scan. Started on Avelox and then given rx for home.  Then given tessalon perles.  Started Avelex yesterday. Last fever was last night around 100.  100.5 this am.  Took IBU.  Still SOB.  Has some pressure in her ears.    Review of Systems     Objective:   Physical Exam  Constitutional: She is oriented to person, place, and time. She appears well-developed and well-nourished.  HENT:  Head: Normocephalic and atraumatic.  Cardiovascular: Normal rate, regular rhythm and normal heart sounds.   Pulmonary/Chest: Effort normal and breath sounds normal. No respiratory distress. She has no wheezes.  Neurological: She is alert and oriented to person, place, and time.  Skin: Skin is warm and dry.  Psychiatric: She has a normal mood and affect.          Assessment & Plan:  F.u PNA form hospital. On Avelox. Still temp earlier today but she is doing better.  Overall trending down. Out of work for 2 more day. Call if not better in one week. Use inhaler prn.  Call if SOB gets worse.

## 2011-02-18 NOTE — Patient Instructions (Signed)
Call if not better in one week Complete your antibiotic.

## 2011-02-23 ENCOUNTER — Ambulatory Visit
Admission: RE | Admit: 2011-02-23 | Discharge: 2011-02-23 | Disposition: A | Discharge status: Home / Self Care | Payer: BLUE CROSS/BLUE SHIELD | Source: Ambulatory Visit | Attending: Specialist | Admitting: Specialist

## 2011-02-23 DIAGNOSIS — M5410 Radiculopathy, site unspecified: Secondary | ICD-10-CM

## 2011-03-14 ENCOUNTER — Encounter: Payer: Self-pay | Admitting: *Deleted

## 2011-03-27 ENCOUNTER — Ambulatory Visit (INDEPENDENT_AMBULATORY_CARE_PROVIDER_SITE_OTHER): Payer: BLUE CROSS/BLUE SHIELD | Admitting: Family Medicine

## 2011-03-27 ENCOUNTER — Encounter: Payer: Self-pay | Admitting: Family Medicine

## 2011-03-27 VITALS — BP 140/83 | HR 78 | Wt 156.0 lb

## 2011-03-27 DIAGNOSIS — N760 Acute vaginitis: Secondary | ICD-10-CM

## 2011-03-27 DIAGNOSIS — Z23 Encounter for immunization: Secondary | ICD-10-CM

## 2011-03-27 LAB — POCT URINALYSIS DIPSTICK
Glucose, UA: NEGATIVE
Ketones, UA: NEGATIVE
Spec Grav, UA: 1.025
Urobilinogen, UA: 0.2

## 2011-03-27 LAB — WET PREP FOR TRICH, YEAST, CLUE: Trich, Wet Prep: NONE SEEN

## 2011-03-27 MED ORDER — SULFAMETHOXAZOLE-TMP DS 800-160 MG PO TABS: 1.0000 | ORAL_TABLET | Freq: Two times a day (BID) | ORAL | Status: AC

## 2011-03-27 NOTE — Patient Instructions (Signed)
We will call you with the lab results.  Call if symptoms not better in about 5 days.

## 2011-03-27 NOTE — Progress Notes (Signed)
  Subjective:    Patient ID: Oaklie Durrett, female    DOB: 07-11-1986, 25 y.o.   MRN: [id number]  HPI Started having internal vaginal itching about a week ago. Notices some discomfort at the end of stream as well. No hematuria.  No fever.  She is currently sexually active. She has IUD in. Called her gyn but couldn't get in for a week.  Also says would like STD testing. Usually gets this once a year and is due.    Review of Systems     Objective:   Physical Exam  Constitutional: She is oriented to person, place, and time. She appears well-developed and well-nourished.  Neurological: She is alert and oriented to person, place, and time.  Psychiatric: She has a normal mood and affect.          Assessment & Plan:  Vaginitis - Wet prep was collected. UA was + so will tx with bactrim bid for 3 days.. Also given lab slip for first AM urine for GC and chlam testing as well as HIV and syphillis to have drawn tomorrow.   UTI - UA was + so will tx with bactrim bid for 3 days.Marland Kitchen

## 2011-03-28 ENCOUNTER — Telehealth: Payer: Self-pay | Admitting: Family Medicine

## 2011-03-28 MED ORDER — METRONIDAZOLE 500 MG PO TABS: 500.0000 mg | ORAL_TABLET | Freq: Two times a day (BID) | ORAL | Status: AC

## 2011-03-28 NOTE — Telephone Encounter (Signed)
Call pt: Wet prep shows + for BV. Will send over rx for metronidazole.

## 2011-03-28 NOTE — Telephone Encounter (Signed)
Pt notifeid of results. KJ LPN 

## 2011-03-29 ENCOUNTER — Telehealth: Payer: Self-pay | Admitting: Family Medicine

## 2011-03-29 LAB — RPR

## 2011-03-29 LAB — GC/CHLAMYDIA PROBE AMP, URINE: Chlamydia, Swab/Urine, PCR: NEGATIVE

## 2011-03-29 NOTE — Telephone Encounter (Signed)
Call patient: HIV and RPR negative. Gonorrhea and Chlamydia are negative. We already called her yesterday about the wet prep results.

## 2011-03-29 NOTE — Telephone Encounter (Signed)
Pt notifeid fo results. KJ LPN

## 2011-04-16 ENCOUNTER — Encounter: Payer: Self-pay | Admitting: Obstetrics & Gynecology

## 2011-04-16 ENCOUNTER — Ambulatory Visit (INDEPENDENT_AMBULATORY_CARE_PROVIDER_SITE_OTHER): Payer: BLUE CROSS/BLUE SHIELD | Admitting: Obstetrics & Gynecology

## 2011-04-16 VITALS — BP 144/89 | HR 78 | Temp 98.6°F | Resp 16 | Ht 62.0 in | Wt 154.0 lb

## 2011-04-16 DIAGNOSIS — Z3040 Encounter for surveillance of contraceptives, unspecified: Secondary | ICD-10-CM

## 2011-04-16 DIAGNOSIS — Z30433 Encounter for removal and reinsertion of intrauterine contraceptive device: Secondary | ICD-10-CM

## 2011-04-16 LAB — POCT URINE PREGNANCY: Preg Test, Ur: NEGATIVE

## 2011-04-16 MED ORDER — LEVONORGESTREL 20 MCG/24HR IU IUD
INTRAUTERINE_SYSTEM | Freq: Once | INTRAUTERINE | Status: AC
  Administered 2011-04-16: 15:00:00 via INTRAUTERINE

## 2011-04-16 MED ORDER — HYDROCORTISONE ACE-PRAMOXINE 1-1 % RE FOAM: 1.0000 | Freq: Two times a day (BID) | RECTAL | Status: AC

## 2011-04-16 NOTE — Progress Notes (Signed)
Addended by: Granville Lewis on: 04/16/2011 05:39 PM   Modules accepted: Orders

## 2011-04-16 NOTE — Patient Instructions (Signed)
Hemorrhoids Hemorrhoids are dilated (enlarged) veins around the rectum. Sometimes clots will form in the veins. This makes them swollen and painful. These are called thrombosed hemorrhoids. Causes of hemorrhoids include:  Pregnancy: this increases the pressure in the hemorrhoidal veins.   Constipation.   Straining to have a bowel movement.  HOME CARE INSTRUCTIONS  Eat a well balanced diet and drink 6 to 8 glasses of water every day to avoid constipation. You may also use a bulk laxative.   Avoid straining to have bowel movements.   Keep anal area dry and clean.   Only take over-the-counter or prescription medicines for pain, discomfort, or fever as directed by your caregiver.  If thrombosed:  Take hot sitz baths for 20 to 30 minutes, 3 to 4 times per day.   If the hemorrhoids are very tender and swollen, place ice packs on area as tolerated. Using ice packs between sitz baths may be helpful. Fill a plastic bag with ice and use a towel between the bag of ice and your skin.   Special creams and suppositories (Anusol, Nupercainal, Wyanoids) may be used or applied as directed.   Do not use a donut shaped pillow or sit on the toilet for long periods. This increases blood pooling and pain.   Move your bowels when your body has the urge; this will require less straining and will decrease pain and pressure.   Only take over-the-counter or prescription medicines for pain, discomfort, or fever as directed by your caregiver.  SEEK MEDICAL CARE IF:  You have increasing pain and swelling that is not controlled with your prescription.   You have uncontrolled bleeding.   You have an inability or difficulty having a bowel movement.   You have pain or inflammation outside the area of the hemorrhoids.   You have chills and/or an oral temperature above 100.4 that lasts for 2 days or longer, or as your caregiver suggests.  MAKE SURE YOU:   Understand these instructions.   Will watch your  condition.   Will get help right away if you are not doing well or get worse.  Document Released: 07/19/2000 Document Re-Released: 07/04/2008 [facility] Patient Information 2011 [place], [place].  What is this medicine? LEVONORGESTREL IUD (LEE voe nor jes trel) is a contraceptive (birth control) device. It is used to prevent pregnancy and to treat heavy bleeding that occurs during your period. It can be used for up to 5 years. This medicine may be used for other purposes; ask your health care provider or pharmacist if you have questions.   What should I tell my health care provider before I take this medicine? They need to know if you have any of these conditions: -abnormal Pap smear -cancer of the breast, uterus, or cervix -diabetes -endometritis -genital or pelvic infection now or in the past -have more than one sexual partner or your partner has more than one partner -heart disease -history of an ectopic or tubal pregnancy -immune system problems -IUD in place -liver disease or tumor -problems with blood clots or take blood-thinners -use intravenous drugs -uterus of unusual shape -vaginal bleeding that has not been explained -an unusual or allergic reaction to levonorgestrel, other hormones, silicone, or polyethylene, medicines, foods, dyes, or preservatives -pregnant or trying to get pregnant -breast-feeding   How should I use this medicine? This device is placed inside the uterus by a health care professional. Talk to your pediatrician regarding the use of this medicine in children. Special care may be needed.  Overdosage: If you think you have taken too much of this medicine contact a poison control center or emergency room at once. NOTE: This medicine is only for you. Do not share this medicine with others.   What if I miss a dose? This does not apply.   What may interact with this medicine? Do not take this medicine with any of the following  medications: -amprenavir -bosentan -fosamprenavir   This medicine may also interact with the following medications: -aprepitant -barbiturate medicines for inducing sleep or treating seizures -bexarotene -griseofulvin -medicines to treat seizures like carbamazepine, ethotoin, felbamate, oxcarbazepine, phenytoin, topiramate -modafinil -pioglitazone -rifabutin -rifampin -rifapentine -some medicines to treat HIV infection like atazanavir, indinavir, lopinavir, nelfinavir, tipranavir, ritonavir -St. John's wort -warfarin   This list may not describe all possible interactions. Give your health care provider a list of all the medicines, herbs, non-prescription drugs, or dietary supplements you use. Also tell them if you smoke, drink alcohol, or use illegal drugs. Some items may interact with your medicine.   What should I watch for while using this medicine? Visit your doctor or health care professional for regular check ups. See your doctor if you or your partner has sexual contact with others, becomes HIV positive, or gets a sexual transmitted disease.   This product does not protect you against HIV infection (AIDS) or other sexually transmitted diseases.   You can check the placement of the IUD yourself by reaching up to the top of your vagina with clean fingers to feel the threads. Do not pull on the threads. It is a good habit to check placement after each menstrual period. Call your doctor right away if you feel more of the IUD than just the threads or if you cannot feel the threads at all.   The IUD may come out by itself. You may become pregnant if the device comes out. If you notice that the IUD has come out use a backup birth control method like condoms and call your health care provider.   Using tampons will not change the position of the IUD and are okay to use during your period.   What side effects may I notice from receiving this medicine? Side effects that you should report  to your doctor or health care professional as soon as possible: -allergic reactions like skin rash, itching or hives, swelling of the face, lips, or tongue -fever, flu-like symptoms -genital sores -high blood pressure -no menstrual period for 6 weeks during use -pain, swelling, warmth in the leg -pelvic pain or tenderness -severe or sudden headache -signs of pregnancy -stomach cramping -sudden shortness of breath -trouble with balance, talking, or walking -unusual vaginal bleeding, discharge -yellowing of the eyes or skin   Side effects that usually do not require medical attention (report to your doctor or health care professional if they continue or are bothersome): -acne -breast pain -change in sex drive or performance -changes in weight -cramping, dizziness, or faintness while the device is being inserted -headache -irregular menstrual bleeding within first 3 to 6 months of use -nausea   This list may not describe all possible side effects. Call your doctor for medical advice about side effects. You may report side effects to FDA at 1-800-FDA-1088.   Where should I keep my medicine? This does not apply.   NOTE:This sheet is a summary. It may not cover all possible information. If you have questions about this medicine, talk to your doctor, pharmacist, or health care provider.  2011, [employer] Standard.

## 2011-04-16 NOTE — Progress Notes (Signed)
  Subjective:    Patient ID: Shirley Ruiz, female    DOB: 04/11/1986, 25 y.o.   MRN: [id number]  HPI Pt presents for IUD removal and insertion.  UPT negative   Review of Systems Pt feels well.  Would like medication for hemorrhoid.    Objective:   Physical Exam  Constitutional: She appears well-developed and well-nourished.  HENT:  Head: Atraumatic.  Genitourinary: Vagina normal and uterus normal.       hemorrhoid   Patient identified, informed consent performed, signed copy in chart, time out was performed.  Urine pregnancy test negative.  Speculum placed in the vagina.  Cervix visualized.  Cleaned with Betadine x 2.  Grasped anteriourly with a single tooth tenaculum.  Uterus sounded to 6.5 cm.  Mirena IUD placed per manufacturer's recommendations.  Strings trimmed to 1 cm per pt request  Patient given post procedure instructions and Mirena care card with expiration date.  Patient is asked to check IUD strings periodically and follow up in 4-6 weeks for IUD check.        Assessment & Plan:  Procofoam for hemorrhoids.  Needs pap in April 2013

## 2011-05-28 ENCOUNTER — Ambulatory Visit: Payer: BLUE CROSS/BLUE SHIELD | Admitting: Obstetrics & Gynecology

## 2011-06-11 ENCOUNTER — Ambulatory Visit (INDEPENDENT_AMBULATORY_CARE_PROVIDER_SITE_OTHER): Payer: BLUE CROSS/BLUE SHIELD | Admitting: Obstetrics & Gynecology

## 2011-06-11 ENCOUNTER — Encounter: Payer: Self-pay | Admitting: Obstetrics & Gynecology

## 2011-06-11 VITALS — BP 124/78 | HR 84 | Ht 62.0 in | Wt 147.0 lb

## 2011-06-11 DIAGNOSIS — Z30431 Encounter for routine checking of intrauterine contraceptive device: Secondary | ICD-10-CM

## 2011-06-11 NOTE — Progress Notes (Signed)
  Subjective:    Patient ID: Shirley Ruiz, female    DOB: 1986-06-12, 25 y.o.   MRN: [id number]  HPI Ms. Shirley Ruiz is here for a string check of her replaced Mirena.  She has no complaints.  She is not feeling her strings herself.   Review of Systems    pap normal 4/12 Objective:   Physical Exam  Short strings visualized No CMT with bimanual exam      Assessment & Plan:  String check with Mirena- she is doing well and will RTC for her annual exam in April.

## 2011-08-16 ENCOUNTER — Ambulatory Visit (INDEPENDENT_AMBULATORY_CARE_PROVIDER_SITE_OTHER): Payer: BLUE CROSS/BLUE SHIELD | Admitting: Family Medicine

## 2011-08-16 ENCOUNTER — Encounter: Payer: Self-pay | Admitting: Family Medicine

## 2011-08-16 VITALS — BP 133/79 | HR 71 | Wt 142.0 lb

## 2011-08-16 DIAGNOSIS — Z23 Encounter for immunization: Secondary | ICD-10-CM

## 2011-08-16 DIAGNOSIS — G4700 Insomnia, unspecified: Secondary | ICD-10-CM

## 2011-08-16 DIAGNOSIS — R197 Diarrhea, unspecified: Secondary | ICD-10-CM

## 2011-08-16 DIAGNOSIS — Z113 Encounter for screening for infections with a predominantly sexual mode of transmission: Secondary | ICD-10-CM

## 2011-08-16 DIAGNOSIS — L659 Nonscarring hair loss, unspecified: Secondary | ICD-10-CM

## 2011-08-16 DIAGNOSIS — J45909 Unspecified asthma, uncomplicated: Secondary | ICD-10-CM

## 2011-08-16 MED ORDER — ESZOPICLONE 2 MG PO TABS: 2.0000 mg | ORAL_TABLET | Freq: Every day | ORAL | Status: DC

## 2011-08-16 NOTE — Progress Notes (Signed)
  Subjective:    Patient ID: Shirley Ruiz, female    DOB: 1985/10/25, 26 y.o.   MRN: [id number]  HPI Having a hard time falling asleep, but once asleep usually does ok.  Says doesn't watch TV to fall asleep. Tried to go to bed at 10 PM.  Sometimes up until 2AM. Then gets so tired actually falls asleep from physical exhaustion. Says her mind is still going.  Denies any major stressors in her life. Says did try a friends [place] but felt really groggy. Tied the otc sleep aids but says they cause HA in the morning.  Says lunesta did work Tried on of her dads and didn't feel bad on it.  No caffine past lunch. Not on phentermine anymore.   Hair loss over last several months. Says it is diffuse and large amt are coming out.  She denies any soreness or rashes on her scalp.  Diarrhea-she says as a child she was diagnosed with IBS. She says overall she's done well up until until this last year and she feels like she has constant diarrhea. She says she gets that with multiple times a day. She denies any blood in the stool or severe abdominal cramping. She does eat a high-fiber diet and over the last year has been trying to get high fiber and high protein diet to help her lose weight. She has continued to lose weight on this regimen.  Review of Systems     Objective:   Physical Exam  Constitutional: She is oriented to person, place, and time. She appears well-developed and well-nourished.  HENT:  Head: Normocephalic and atraumatic.  Mouth/Throat: Oropharynx is clear and moist.  Eyes: Conjunctivae are normal. Pupils are equal, round, and reactive to light.  Neck: Neck supple. No thyromegaly present.  Cardiovascular: Normal rate, regular rhythm and normal heart sounds.   Pulmonary/Chest: Effort normal and breath sounds normal.  Musculoskeletal: She exhibits no edema.  Lymphadenopathy:    She has no cervical adenopathy.  Neurological: She is alert and oriented to person, place, and time.  Skin: Skin is  warm and dry.  Psychiatric: She has a normal mood and affect. Her behavior is normal.          Assessment & Plan:  Insomnia - Discussed sleep hygiene.  We will also try lunesta for short periods. Discussed dependency issues.  Also given a handout. Followup in 6 weeks to see if this is helping her and make sure she's not having any side effects.  Hair loss - Will check TSH and CBC.    Tdap and Pneumonia vaccines given today.   She also wants STD screening. I did him a lab slip for this today.  Diarrhea-I suspect this may be related to her irritable bowel syndrome though she may also have dairy or gluten intolerance. I did recommend a trial of no dairy for 2-3 weeks to see if it improves her symptoms. If she has not noticed a difference and again better consider GI referral. I do not hear any red flag symptoms such as blood in the stool, fever or significant pain. She already eats a high fiber diet which is usually the mainstay of treatment for irritable bowel syndrome.

## 2011-08-16 NOTE — Patient Instructions (Signed)

## 2011-08-17 LAB — CBC
HCT: 41.1 % (ref 36.0–46.0)
Hemoglobin: 14.2 g/dL (ref 12.0–15.0)
MCH: 30.9 pg (ref 26.0–34.0)
MCV: 89.3 fL (ref 78.0–100.0)
RBC: 4.6 MIL/uL (ref 3.87–5.11)

## 2011-09-27 ENCOUNTER — Encounter: Payer: Self-pay | Admitting: *Deleted

## 2011-09-30 ENCOUNTER — Ambulatory Visit: Payer: BLUE CROSS/BLUE SHIELD | Admitting: Family Medicine

## 2011-09-30 DIAGNOSIS — Z0289 Encounter for other administrative examinations: Secondary | ICD-10-CM

## 2012-02-20 ENCOUNTER — Other Ambulatory Visit: Payer: Self-pay | Admitting: *Deleted

## 2012-02-20 NOTE — Telephone Encounter (Signed)
Ok to fill proair inhaler.

## 2012-02-20 NOTE — Telephone Encounter (Signed)
Pt has called requesting a refill on Proair inhaler. States she called pharmacy and the last time it was filled was 2011. States she has needed more lately. Please advise.

## 2012-02-21 MED ORDER — ALBUTEROL SULFATE HFA 108 (90 BASE) MCG/ACT IN AERS: 2.0000 | INHALATION_SPRAY | Freq: Four times a day (QID) | RESPIRATORY_TRACT | Status: DC | PRN

## 2012-02-21 NOTE — Telephone Encounter (Signed)
Pt informed that med sent to pharmacy. Pt also informed that if she needs to use inhaler 2-3 times a week then she needs appt per Dr. Linford Arnold.

## 2012-03-12 ENCOUNTER — Other Ambulatory Visit: Payer: Self-pay | Admitting: Obstetrics & Gynecology

## 2012-03-12 ENCOUNTER — Encounter: Payer: Self-pay | Admitting: Obstetrics & Gynecology

## 2012-03-12 ENCOUNTER — Ambulatory Visit (INDEPENDENT_AMBULATORY_CARE_PROVIDER_SITE_OTHER): Payer: BLUE CROSS/BLUE SHIELD | Admitting: Obstetrics & Gynecology

## 2012-03-12 VITALS — BP 141/86 | HR 65 | Ht 62.0 in | Wt 137.0 lb

## 2012-03-12 DIAGNOSIS — Z01419 Encounter for gynecological examination (general) (routine) without abnormal findings: Secondary | ICD-10-CM

## 2012-03-12 DIAGNOSIS — Z9189 Other specified personal risk factors, not elsewhere classified: Secondary | ICD-10-CM

## 2012-03-12 DIAGNOSIS — Z113 Encounter for screening for infections with a predominantly sexual mode of transmission: Secondary | ICD-10-CM

## 2012-03-12 DIAGNOSIS — Z202 Contact with and (suspected) exposure to infections with a predominantly sexual mode of transmission: Secondary | ICD-10-CM

## 2012-03-12 DIAGNOSIS — Z124 Encounter for screening for malignant neoplasm of cervix: Secondary | ICD-10-CM

## 2012-03-12 DIAGNOSIS — Z1151 Encounter for screening for human papillomavirus (HPV): Secondary | ICD-10-CM

## 2012-03-12 NOTE — Progress Notes (Signed)
Here for repeat pap and does want STI screening.

## 2012-03-12 NOTE — Progress Notes (Signed)
  Subjective:     Shirley Ruiz is a 26 y.o. female here for a routine exam.  Current complaints: none.  Personal health questionnaire reviewed: yes.   Gynecologic History No LMP recorded. Patient is not currently having periods (Reason: IUD). Contraception: IUD Last Pap: 2012. Results were: normal Last mammogram: n/a. Results were:  Obstetric History OB History    Grav Para Term Preterm Abortions TAB SAB Ect Mult Living   1 1 1       1      # Outc Date GA Lbr Len/2nd Wgt Sex Del Anes PTL Lv   1 TRM                The following portions of the patient's history were reviewed and updated as appropriate: allergies, current medications, past family history, past medical history, past social history, past surgical history and problem list.  Review of Systems A comprehensive review of systems was negative.    Objective:    Vitals:  WNL General appearance: alert, cooperative and no distress Head: Normocephalic, without obvious abnormality, atraumatic Eyes: negative Throat: lips, mucosa, and tongue normal; teeth and gums normal Lungs: clear to auscultation bilaterally Breasts: normal appearance, no masses or tenderness, No nipple retraction or dimpling, No nipple discharge or bleeding Heart: regular rate and rhythm Abdomen: soft, non-tender; bowel sounds normal; no masses,  no organomegaly Pelvic: cervix normal in appearance, external genitalia normal, no adnexal masses or tenderness, no bladder tenderness, no cervical motion tenderness, perianal skin: no external genital warts noted, urethra without abnormality or discharge, uterus normal size, shape, and consistency and vagina normal without discharge Extremities: no edema, redness or tenderness in the calves or thighs Skin: no lesions or rash Lymph nodes: Axillary adenopathy: none       Assessment:    Healthy female exam.    Plan:    Education reviewed: safe sex/STD prevention, self breast exams and skin cancer  screening. Contraception: IUD. Follow up in: 1 year. STD testing

## 2012-03-13 LAB — HEPATITIS C ANTIBODY: HCV Ab: NEGATIVE

## 2012-03-13 LAB — HIV ANTIBODY (ROUTINE TESTING W REFLEX): HIV: NONREACTIVE

## 2012-03-13 LAB — HEPATITIS B SURFACE ANTIBODY,QUALITATIVE: Hep B S Ab: POSITIVE — AB

## 2012-03-13 LAB — WET PREP, GENITAL: Trich, Wet Prep: NONE SEEN

## 2012-03-18 ENCOUNTER — Telehealth: Payer: Self-pay | Admitting: *Deleted

## 2012-03-18 DIAGNOSIS — Z9189 Other specified personal risk factors, not elsewhere classified: Secondary | ICD-10-CM

## 2012-03-18 NOTE — Telephone Encounter (Signed)
LM on pt's cell phone that she needs to come in within the next week for bloodwork which was omitted @ her last visit.  Lab requisition printed.

## 2012-04-14 ENCOUNTER — Encounter: Payer: Self-pay | Admitting: Obstetrics & Gynecology

## 2012-04-14 ENCOUNTER — Ambulatory Visit (INDEPENDENT_AMBULATORY_CARE_PROVIDER_SITE_OTHER): Payer: BLUE CROSS/BLUE SHIELD | Admitting: Obstetrics & Gynecology

## 2012-04-14 VITALS — BP 135/87 | HR 70 | Temp 97.6°F | Resp 20 | Wt 138.0 lb

## 2012-04-14 DIAGNOSIS — N898 Other specified noninflammatory disorders of vagina: Secondary | ICD-10-CM

## 2012-04-14 DIAGNOSIS — Z113 Encounter for screening for infections with a predominantly sexual mode of transmission: Secondary | ICD-10-CM

## 2012-04-14 MED ORDER — METRONIDAZOLE 500 MG PO TABS: 500.0000 mg | ORAL_TABLET | Freq: Two times a day (BID) | ORAL | Status: AC

## 2012-04-14 NOTE — Progress Notes (Signed)
  Subjective:    Patient ID: Shirley Ruiz, female    DOB: 02-20-1986, 26 y.o.   MRN: [id number]  HPI Pt c/o fishy odor and discharge.  Pt has had BV several times.  Pt had anal intercourse and then vaginal.  When this happens she gets BV.  Pt encouraged to use condoms when switching from anal to vaginal intercourse.  Wet prep is positive for clue cells   Review of Systems  Genitourinary: Positive for vaginal discharge. Negative for vaginal bleeding, vaginal pain and pelvic pain.       Objective:   Physical Exam  Vitals reviewed. Constitutional: She appears well-developed and well-nourished. No distress.  HENT:  Head: Normocephalic and atraumatic.  Genitourinary: Uterus normal. Pelvic exam was performed with patient supine. Cervix exhibits no motion tenderness. Vaginal discharge found.          Assessment & Plan:  26 year old female with BV  Flagyl po bid for one week Pt needs Hep B Surface antigen drawn.

## 2012-04-15 ENCOUNTER — Telehealth: Payer: Self-pay | Admitting: *Deleted

## 2012-04-15 NOTE — Telephone Encounter (Signed)
LM on cell phone voicemail that Hep B was neg.

## 2012-05-20 ENCOUNTER — Encounter: Payer: Self-pay | Admitting: Obstetrics & Gynecology

## 2012-05-20 ENCOUNTER — Ambulatory Visit (INDEPENDENT_AMBULATORY_CARE_PROVIDER_SITE_OTHER): Payer: BC Managed Care – PPO | Admitting: Obstetrics & Gynecology

## 2012-05-20 VITALS — BP 127/75 | HR 59 | Temp 97.7°F | Resp 16 | Ht 61.0 in | Wt 140.0 lb

## 2012-05-20 DIAGNOSIS — Z975 Presence of (intrauterine) contraceptive device: Secondary | ICD-10-CM

## 2012-05-20 DIAGNOSIS — R102 Pelvic and perineal pain unspecified side: Secondary | ICD-10-CM | POA: Insufficient documentation

## 2012-05-20 DIAGNOSIS — R10813 Right lower quadrant abdominal tenderness: Secondary | ICD-10-CM

## 2012-05-20 DIAGNOSIS — N949 Unspecified condition associated with female genital organs and menstrual cycle: Secondary | ICD-10-CM

## 2012-05-20 LAB — POCT URINALYSIS DIPSTICK
Bilirubin, UA: NEGATIVE
Blood, UA: NEGATIVE
Glucose, UA: NEGATIVE
Ketones, UA: NEGATIVE
Spec Grav, UA: 1.02

## 2012-05-20 LAB — POCT URINE PREGNANCY: Preg Test, Ur: NEGATIVE

## 2012-05-20 NOTE — Progress Notes (Signed)
  Subjective:     Shirley Ruiz is a 26 y.o. female who presents for evaluation of abdominal pain. The pain is described as cramping and pressure-like, and is 4/10 in intensity. Pain is located in the RLQ area without radiation. Onset was gradual occurring 1 month ago. Symptoms have been unchanged since. Aggravating factors: movement. Alleviating factors: recumbency. Associated symptoms: vaginal discharge, bladder fullness. The patient denies anorexia, constipation, diarrhea, dysuria, fever, hematuria, nausea and vomiting. Risk factors for pelvic/abdominal pain include IUD in place.  Menstrual History: OB History    Grav Para Term Preterm Abortions TAB SAB Ect Mult Living   1 1 1       1       No LMP recorded. Patient is not currently having periods (Reason: IUD).    The following portions of the patient's history were reviewed and updated as appropriate: allergies, current medications, past family history, past medical history, past social history, past surgical history and problem list.   Review of Systems Pertinent items are noted in HPI.    Objective:    BP 127/75  Pulse 59  Temp 97.7 F (36.5 C) (Oral)  Resp 16  Ht 5\' 1"  (1.549 m)  Wt 140 lb (63.504 kg)  BMI 26.45 kg/m2 General:   alert, cooperative and no distress  Lungs:   nml effort  Heart:   not examined  Abdomen:  soft, non-tender; bowel sounds normal; no masses,  no organomegaly  CVA:   not examined  Pelvis:  External genitalia: normal general appearance Urinary system: urethral meatus normal and bladder tender to palpation Vaginal: normal without tenderness, induration or masses and normal rugae Cervix: normal appearance and IUD string visualized Adnexa: mass, right Uterus: normal single, nontender and anteverted Clinical staff offered to be present for exam: yes  Initials: LC  Extremities:   extremities normal, atraumatic, no cyanosis or edema and   Neurologic:   negative  Psychiatric:   normal mood, behavior,  speech, dress, and thought processes   Lab Review Labs: Urine pregnancy test, GC/Chlamydia DNA probe of cervico/vaginal secretions and Wet mount of vaginal secretions   Imaging Ultrasound - Pelvic Vaginal, Ultrasound - Pelvic Abdominal    Assessment:    Functional: ovarian cyst on the right    Plan:    The diagnosis was discussed with the patient and evaluation and treatment plans outlined. See orders for lab and imaging studies. Reassured patient that symptoms are almost certainly benign and self-resolving. Call back with update in 2 weeks.

## 2012-05-20 NOTE — Patient Instructions (Addendum)
Ovarian Cyst The ovaries are small organs that are on each side of the uterus. The ovaries are the organs that produce the female hormones, estrogen and progesterone. An ovarian cyst is a sac filled with fluid that can vary in its size. It is normal for a small cyst to form in women who are in the childbearing age and who have menstrual periods. This type of cyst is called a follicle cyst that becomes an ovulation cyst (corpus luteum cyst) after it produces the women's egg. It later goes away on its own if the woman does not become pregnant. There are other kinds of ovarian cysts that may cause problems and may need to be treated. The most serious problem is a cyst with cancer. It should be noted that menopausal women who have an ovarian cyst are at a higher risk of it being a cancer cyst. They should be evaluated very quickly, thoroughly and followed closely. This is especially true in menopausal women because of the high rate of ovarian cancer in women in menopause. CAUSES AND TYPES OF OVARIAN CYSTS:  FUNCTIONAL CYST: The follicle/corpus luteum cyst is a functional cyst that occurs every month during ovulation with the menstrual cycle. They go away with the next menstrual cycle if the woman does not get pregnant. Usually, there are no symptoms with a functional cyst.  ENDOMETRIOMA CYST: This cyst develops from the lining of the uterus tissue. This cyst gets in or on the ovary. It grows every month from the bleeding during the menstrual period. It is also called a "chocolate cyst" because it becomes filled with blood that turns brown. This cyst can cause pain in the lower abdomen during intercourse and with your menstrual period.  CYSTADENOMA CYST: This cyst develops from the cells on the outside of the ovary. They usually are not cancerous. They can get very big and cause lower abdomen pain and pain with intercourse. This type of cyst can twist on itself, cut off its blood supply and cause severe pain. It  also can easily rupture and cause a lot of pain.  DERMOID CYST: This type of cyst is sometimes found in both ovaries. They are found to have different kinds of body tissue in the cyst. The tissue includes skin, teeth, hair, and/or cartilage. They usually do not have symptoms unless they get very big. Dermoid cysts are rarely cancerous.  POLYCYSTIC OVARY: This is a rare condition with hormone problems that produces many small cysts on both ovaries. The cysts are follicle-like cysts that never produce an egg and become a corpus luteum. It can cause an increase in body weight, infertility, acne, increase in body and facial hair and lack of menstrual periods or rare menstrual periods. Many women with this problem develop type 2 diabetes. The exact cause of this problem is unknown. A polycystic ovary is rarely cancerous.  THECA LUTEIN CYST: Occurs when too much hormone (human chorionic gonadotropin) is produced and over-stimulates the ovaries to produce an egg. They are frequently seen when doctors stimulate the ovaries for invitro-fertilization (test tube babies).  LUTEOMA CYST: This cyst is seen during pregnancy. Rarely it can cause an obstruction to the birth canal during labor and delivery. They usually go away after delivery. SYMPTOMS   Pelvic pain or pressure.  Pain during sexual intercourse.  Increasing girth (swelling) of the abdomen.  Abnormal menstrual periods.  Increasing pain with menstrual periods.  You stop having menstrual periods and you are not pregnant. DIAGNOSIS  The diagnosis can   be made during:  Routine or annual pelvic examination (common).  Ultrasound.  X-ray of the pelvis.  CT Scan.  MRI.  Blood tests. TREATMENT   Treatment may only be to follow the cyst monthly for 2 to 3 months with your caregiver. Many go away on their own, especially functional cysts.  May be aspirated (drained) with a long needle with ultrasound, or by laparoscopy (inserting a tube into  the pelvis through a small incision).  The whole cyst can be removed by laparoscopy.  Sometimes the cyst may need to be removed through an incision in the lower abdomen.  Hormone treatment is sometimes used to help dissolve certain cysts.  Birth control pills are sometimes used to help dissolve certain cysts. HOME CARE INSTRUCTIONS  Follow your caregiver's advice regarding:  Medicine.  Follow up visits to evaluate and treat the cyst.  You may need to come back or make an appointment with another caregiver, to find the exact cause of your cyst, if your caregiver is not a gynecologist.  Get your yearly and recommended pelvic examinations and Pap tests.  Let your caregiver know if you have had an ovarian cyst in the past. SEEK MEDICAL CARE IF:   Your periods are late, irregular, they stop, or are painful.  Your stomach (abdomen) or pelvic pain does not go away.  Your stomach becomes larger or swollen.  You have pressure on your bladder or trouble emptying your bladder completely.  You have painful sexual intercourse.  You have feelings of fullness, pressure, or discomfort in your stomach.  You lose weight for no apparent reason.  You feel generally ill.  You become constipated.  You lose your appetite.  You develop acne.  You have an increase in body and facial hair.  You are gaining weight, without changing your exercise and eating habits.  You think you are pregnant. SEEK IMMEDIATE MEDICAL CARE IF:   You have increasing abdominal pain.  You feel sick to your stomach (nausea) and/or vomit.  You develop a fever that comes on suddenly.  You develop abdominal pain during a bowel movement.  Your menstrual periods become heavier than usual. Document Released: 07/22/2005 Document Revised: 10/14/2011 Document Reviewed: 05/25/2009 [facility] Patient Information 2013 [place], [place].  Place pelvic pain patient instructions here.  Thank you for enrolling in MyChart.  Please follow the instructions below to securely access your online medical record. MyChart allows you to send messages to your doctor, view your test results, manage appointments, and more.   How Do I Sign Up? 1. In your Internet browser, go to [employer] and enter [email or web address]. 2. Click on the Sign Up Now link in the Sign In box. You will see the New Member Sign Up page. 3. Enter your MyChart Access Code exactly as it appears below. You will not need to use this code after you've completed the sign-up process. If you do not sign up before the expiration date, you must request a new code. MyChart Access Code: [email or web address] Expires: 06/19/2012  9:50 AM  4. Enter your Social Security Number ([id number]) and Date of Birth (mm/dd/yyyy) as indicated and click Submit. You will be taken to the next sign-up page. 5. Create a MyChart ID. This will be your MyChart login ID and cannot be changed, so think of one that is secure and easy to remember. 6. Create a MyChart password. You can change your password at any time. 7. Enter your Password Reset Question and Answer. This can be used  at a later time if you forget your password.  8. Enter your e-mail address. You will receive e-mail notification when new information is available in MyChart. 9. Click Sign Up. You can now view your medical record.   Additional Information Remember, MyChart is NOT to be used for urgent needs. For medical emergencies, dial 911.

## 2012-05-22 ENCOUNTER — Ambulatory Visit (HOSPITAL_COMMUNITY)
Admission: RE | Admit: 2012-05-22 | Discharge: 2012-05-22 | Disposition: A | Discharge status: Home / Self Care | Payer: BC Managed Care – PPO | Source: Ambulatory Visit | Attending: Obstetrics & Gynecology | Admitting: Obstetrics & Gynecology

## 2012-05-22 DIAGNOSIS — R102 Pelvic and perineal pain: Secondary | ICD-10-CM

## 2012-05-22 DIAGNOSIS — Z30431 Encounter for routine checking of intrauterine contraceptive device: Secondary | ICD-10-CM | POA: Insufficient documentation

## 2012-05-22 DIAGNOSIS — R1031 Right lower quadrant pain: Secondary | ICD-10-CM | POA: Insufficient documentation

## 2012-05-22 DIAGNOSIS — N83209 Unspecified ovarian cyst, unspecified side: Secondary | ICD-10-CM | POA: Insufficient documentation

## 2012-05-22 DIAGNOSIS — N949 Unspecified condition associated with female genital organs and menstrual cycle: Secondary | ICD-10-CM | POA: Insufficient documentation

## 2012-05-22 LAB — WET PREP BY MOLECULAR PROBE: Candida species: POSITIVE — AB

## 2012-05-25 ENCOUNTER — Other Ambulatory Visit: Payer: Self-pay | Admitting: Obstetrics & Gynecology

## 2012-05-25 MED ORDER — FLUCONAZOLE 150 MG PO TABS: 150.0000 mg | ORAL_TABLET | Freq: Once | ORAL | Status: DC

## 2012-05-26 ENCOUNTER — Telehealth: Payer: Self-pay | Admitting: *Deleted

## 2012-05-26 NOTE — Telephone Encounter (Signed)
LM on cell phone that her Affirm came back positive for Gardnerella and Dr Penne Lash has sent a RX for Diflucan to [facility].

## 2012-06-02 ENCOUNTER — Telehealth: Payer: Self-pay | Admitting: *Deleted

## 2012-06-02 NOTE — Telephone Encounter (Signed)
This is actually an addendum to telephone call on 05/26/12.  I wrote that pt had Garnerella but meant to write Candida.  Pt was treated with the appropriate meds.

## 2012-07-22 ENCOUNTER — Ambulatory Visit (INDEPENDENT_AMBULATORY_CARE_PROVIDER_SITE_OTHER): Payer: BC Managed Care – PPO

## 2012-07-22 ENCOUNTER — Encounter: Payer: Self-pay | Admitting: Physician Assistant

## 2012-07-22 ENCOUNTER — Ambulatory Visit (INDEPENDENT_AMBULATORY_CARE_PROVIDER_SITE_OTHER): Payer: BC Managed Care – PPO | Admitting: Physician Assistant

## 2012-07-22 VITALS — BP 113/68 | HR 64 | Temp 97.8°F | Resp 18 | Wt 136.0 lb

## 2012-07-22 DIAGNOSIS — Z8701 Personal history of pneumonia (recurrent): Secondary | ICD-10-CM

## 2012-07-22 DIAGNOSIS — J45909 Unspecified asthma, uncomplicated: Secondary | ICD-10-CM

## 2012-07-22 DIAGNOSIS — R0789 Other chest pain: Secondary | ICD-10-CM

## 2012-07-22 DIAGNOSIS — R0602 Shortness of breath: Secondary | ICD-10-CM

## 2012-07-22 DIAGNOSIS — J45901 Unspecified asthma with (acute) exacerbation: Secondary | ICD-10-CM

## 2012-07-22 LAB — PR PEAK EXPIRATORY FLOW RATE (P

## 2012-07-22 MED ORDER — PREDNISONE 20 MG PO TABS: 20.0000 mg | ORAL_TABLET | Freq: Two times a day (BID) | ORAL | Status: DC

## 2012-07-22 NOTE — Progress Notes (Signed)
  Subjective:    Patient ID: Shirley Ruiz, female    DOB: [birth year], 26 y.o.   MRN: [id number]  HPI Patient presents to the clinic with SOB that has worsened for the last 48 hours. She doesn't feel like she is wheezing but feels like her chest is tight and lots of pressure. She has albuterol inhaler and nebulizer. She does feel like nebulizer helps more than the inhaler. Last breathing treatment was last night at 8. Denies any fever, chills, muscle aches, sweats. She has had a dry cough. She has asthma and has a history of pneumonia without fever. She did have some loose stools on Monday but not since.    Review of Systems     Objective:   Physical Exam  Constitutional: She is oriented to person, place, and time. She appears well-developed and well-nourished.  HENT:  Head: Normocephalic and atraumatic.  Right Ear: External ear normal.  Left Ear: External ear normal.  Mouth/Throat: Oropharynx is clear and moist.       TM's clear bilaterally.   Nasal turbinates red and swollen.   Eyes: Conjunctivae normal are normal.  Neck: Normal range of motion. Neck supple.  Cardiovascular: Normal rate, regular rhythm and normal heart sounds.   Pulmonary/Chest:       Decreased air movement but did not hear wheezing or rhonchi.  Lymphadenopathy:    She has no cervical adenopathy.  Neurological: She is alert and oriented to person, place, and time.  Skin: Skin is warm and dry.  Psychiatric: She has a normal mood and affect. Her behavior is normal.          Assessment & Plan:  Asthma Exacerbation- Peak flow was 410 and in the caution yellow zone. Pt declined neb in office stated she would do one when she got home. Will get chest xray to make sure no pneumonia. Gave prednisone for 5 days. Continue to use albuterol inhaler regularly for next 2 weeks if having to use more or still using regularly call office.

## 2012-07-22 NOTE — Patient Instructions (Addendum)
Will call with CXR results.   Start prednisone. Continue albuterol inhaler at least twice a day. If still needed albuterol after 2 weeks need to take about daily inhaler.

## 2012-08-31 ENCOUNTER — Telehealth: Payer: Self-pay | Admitting: Family Medicine

## 2012-08-31 NOTE — Telephone Encounter (Signed)
Patient called and left a message stating that she has been having trouble sleeping and once before Dr. Linford Arnold gave her Alfonso Patten to get her on track and wants to know if she will do that again for her?? Call patient back. Thanks

## 2012-09-01 MED ORDER — ESZOPICLONE 2 MG PO TABS: 2.0000 mg | ORAL_TABLET | Freq: Every day | ORAL | Status: DC

## 2012-09-01 NOTE — Telephone Encounter (Signed)
Ok to refill x 1 mo.

## 2012-09-23 ENCOUNTER — Ambulatory Visit (INDEPENDENT_AMBULATORY_CARE_PROVIDER_SITE_OTHER): Payer: BC Managed Care – PPO | Admitting: Physician Assistant

## 2012-09-23 ENCOUNTER — Encounter: Payer: Self-pay | Admitting: Physician Assistant

## 2012-09-23 VITALS — BP 126/75 | HR 67 | Wt 137.0 lb

## 2012-09-23 DIAGNOSIS — F4323 Adjustment disorder with mixed anxiety and depressed mood: Secondary | ICD-10-CM

## 2012-09-23 DIAGNOSIS — G43909 Migraine, unspecified, not intractable, without status migrainosus: Secondary | ICD-10-CM | POA: Insufficient documentation

## 2012-09-23 MED ORDER — BUPROPION HCL ER (XL) 150 MG PO TB24: 150.0000 mg | ORAL_TABLET | Freq: Every day | ORAL | Status: DC

## 2012-09-23 MED ORDER — KETOROLAC TROMETHAMINE 60 MG/2ML IM SOLN
60.0000 mg | Freq: Once | INTRAMUSCULAR | Status: AC
  Administered 2012-09-23: 60 mg via INTRAMUSCULAR

## 2012-09-23 MED ORDER — ONDANSETRON HCL 4 MG PO TABS: 4.0000 mg | ORAL_TABLET | Freq: Three times a day (TID) | ORAL | Status: DC | PRN

## 2012-09-23 MED ORDER — SUMATRIPTAN SUCCINATE 100 MG PO TABS: 100.0000 mg | ORAL_TABLET | ORAL | Status: DC | PRN

## 2012-09-23 NOTE — Progress Notes (Signed)
  Subjective:    Patient ID: Shirley Ruiz, female    DOB: 1986/03/22, 27 y.o.   MRN: [id number]  HPI Patient is a 27 year old female who presents to the clinic with an ongoing migraine for the last 30 hours. Migraine started yesterday morning and she took 4 Advil. Medication did not touch the headache. She then took 2 Excedrin Migraine that did not help. At bedtime last night she took Norco in an antinausea which helped minimally. She woke up this morning with a headache still continuing. She describes it as a dull throbbing headache around 4 head and top of head. She does have a history of occasional migraines however they have picked started to become more frequent. She would estimate on average once a month having a migraine. She does not know of any other triggers other than extreme amount of stress. She is in a custody battle with her daughter and right now not being able to see her regularly. She did describe flashing lights before migraine started yesterday. She has had nausea but no vomiting. She is very sensitive to light and sounds. She does have a history of depression and anxiety but has not been on medications in over a year. She was on Zoloft previously but felt it made her too tired.     Review of Systems     Objective:   Physical Exam  Constitutional: She is oriented to person, place, and time. She appears well-developed and well-nourished.  HENT:  Head: Normocephalic and atraumatic.  Eyes: Conjunctivae and EOM are normal. Pupils are equal, round, and reactive to light.  Cardiovascular: Normal rate, regular rhythm and normal heart sounds.   Pulmonary/Chest: Effort normal and breath sounds normal.  Neurological: She is alert and oriented to person, place, and time. No cranial nerve deficit.  Skin: Skin is warm and dry.  Psychiatric:  Flat affect.          Assessment & Plan:  Migraine with aura/adjustment disorder with anxiety and depression-Toradol injection 60 IM was  given. Patient was sent a prescription for Zofran for nausea. Gave imitrex to try for next migraine or when aura start. If stress is a trigger will go ahead and start treating stress/anxiety.  Pt started on Wellbutrin in the am. Discussed with patient side effects and could worsen depression if that occurs then call office and stop medication. Follow up in 6 weeks.

## 2012-09-23 NOTE — Patient Instructions (Addendum)
Start Wellbutrin XL 150mg  in the morning.   Migraine Headache A migraine headache is an intense, throbbing pain on one or both sides of your head. A migraine can last for 30 minutes to several hours. CAUSES  The exact cause of a migraine headache is not always known. However, a migraine may be caused when nerves in the brain become irritated and release chemicals that cause inflammation. This causes pain. SYMPTOMS  Pain on one or both sides of your head.  Pulsating or throbbing pain.  Severe pain that prevents daily activities.  Pain that is aggravated by any physical activity.  Nausea, vomiting, or both.  Dizziness.  Pain with exposure to bright lights, loud noises, or activity.  General sensitivity to bright lights, loud noises, or smells. Before you get a migraine, you may get warning signs that a migraine is coming (aura). An aura may include:  Seeing flashing lights.  Seeing bright spots, halos, or zig-zag lines.  Having tunnel vision or blurred vision.  Having feelings of numbness or tingling.  Having trouble talking.  Having muscle weakness. MIGRAINE TRIGGERS  Alcohol.  Smoking.  Stress.  Menstruation.  Aged cheeses.  Foods or drinks that contain nitrates, glutamate, aspartame, or tyramine.  Lack of sleep.  Chocolate.  Caffeine.  Hunger.  Physical exertion.  Fatigue.  Medicines used to treat chest pain (nitroglycerine), birth control pills, estrogen, and some blood pressure medicines. DIAGNOSIS  A migraine headache is often diagnosed based on:  Symptoms.  Physical examination.  A CT scan or MRI of your head. TREATMENT Medicines may be given for pain and nausea. Medicines can also be given to help prevent recurrent migraines.  HOME CARE INSTRUCTIONS  Only take over-the-counter or prescription medicines for pain or discomfort as directed by your caregiver. The use of long-term narcotics is not recommended.  Lie down in a dark, quiet  room when you have a migraine.  Keep a journal to find out what may trigger your migraine headaches. For example, write down:  What you eat and drink.  How much sleep you get.  Any change to your diet or medicines.  Limit alcohol consumption.  Quit smoking if you smoke.  Get 7 to 9 hours of sleep, or as recommended by your caregiver.  Limit stress.  Keep lights dim if bright lights bother you and make your migraines worse. SEEK IMMEDIATE MEDICAL CARE IF:   Your migraine becomes severe.  You have a fever.  You have a stiff neck.  You have vision loss.  You have muscular weakness or loss of muscle control.  You start losing your balance or have trouble walking.  You feel faint or pass out.  You have severe symptoms that are different from your first symptoms. MAKE SURE YOU:   Understand these instructions.  Will watch your condition.  Will get help right away if you are not doing well or get worse. Document Released: 07/22/2005 Document Revised: 10/14/2011 Document Reviewed: 07/12/2011 [facility] Patient Information 2013 [place], [place].

## 2012-10-01 ENCOUNTER — Other Ambulatory Visit: Payer: Self-pay | Admitting: Family Medicine

## 2012-12-02 ENCOUNTER — Other Ambulatory Visit: Payer: Self-pay | Admitting: Physician Assistant

## 2013-01-04 ENCOUNTER — Other Ambulatory Visit: Payer: Self-pay | Admitting: Physician Assistant

## 2013-01-18 ENCOUNTER — Ambulatory Visit (INDEPENDENT_AMBULATORY_CARE_PROVIDER_SITE_OTHER): Payer: BC Managed Care – PPO | Admitting: Physician Assistant

## 2013-01-18 ENCOUNTER — Encounter: Payer: Self-pay | Admitting: Physician Assistant

## 2013-01-18 VITALS — BP 125/86 | HR 89 | Wt 132.0 lb

## 2013-01-18 DIAGNOSIS — Z13220 Encounter for screening for lipoid disorders: Secondary | ICD-10-CM

## 2013-01-18 DIAGNOSIS — R079 Chest pain, unspecified: Secondary | ICD-10-CM

## 2013-01-18 DIAGNOSIS — R002 Palpitations: Secondary | ICD-10-CM

## 2013-01-18 DIAGNOSIS — Z131 Encounter for screening for diabetes mellitus: Secondary | ICD-10-CM

## 2013-01-18 NOTE — Progress Notes (Signed)
  Subjective:    Patient ID: Shirley Ruiz, female    DOB: 20-Jul-1986, 27 y.o.   MRN: [id number]  HPI Patient is a 27 yo female who presents to the clinic to discuss ongoing palpitations. She has had skipping beats for over a year but for the last 2 months they have been occuring everyday and multiple times a day. Today she has had at least 4 palpitations. Last Friday she had an episode where she felt like she was going to pass out but did not. Checked BP and was lower than normal at 93/64 and after she rested went up to 130/88. She also had an episode one night where she woke up with sharp chest pain she was very nauseated and her heart felted tight. She does work 2 jobs and is very stressed. Her dad has had a heart attack before and that does concern her. She does take wellbutrin and seems to help her feelings of depression and anxiety a lot. Denies any excessive alcohol use, caffiene, tobacco, or medications. Not aware of anything that makes palpitations worse or better. Denies any asthma symptoms wheezing or SOB. She does have asthma and has tried inhaler when feels like she is having palpitations but has not improved symptoms.    Review of Systems     Objective:   Physical Exam  Constitutional: She is oriented to person, place, and time. She appears well-developed and well-nourished.  HENT:  Head: Normocephalic.  Cardiovascular: Normal rate, regular rhythm and normal heart sounds.   Pulmonary/Chest: Effort normal and breath sounds normal.  Abdominal: Bowel sounds are normal.  Neurological: She is alert and oriented to person, place, and time.  Skin: Skin is warm and dry.  Psychiatric: She has a normal mood and affect. Her behavior is normal.          Assessment & Plan:  Palpitations-EKG NSR at 68, No acute ST elevation or depression. No arrhymia. Sounds like pt is having PVC's. Gave handout. Encouraged patient to keep a diary of what she is eating and drinking. She denies intake of  most common triggers. Another option is stress could be making symptoms worse. Gave noet to decrease hours of 2nd job to see if that helps. Will check TSH, CBC, lipid profile, CMP. Will also refer to cardiology so that patient can get holter monitor. Could consider Stress test also if symptoms persist. Follow up in 2 months.   Spent and 50 percent of visit was spent counseling patient regarding treatment plan and reassurance for palpitations.

## 2013-01-18 NOTE — Patient Instructions (Addendum)
Premature Ventricular Contraction Premature ventricular contraction (PVC) is an irregularity of the heart rhythm involving extra or skipped heartbeats. In some cases, they may occur without obvious cause or heart disease. Other times, they can be caused by an electrolyte change in the blood. These need to be corrected. They can also be seen when there is not enough oxygen going to the heart. A common cause of this is plaque or cholesterol buildup. This buildup decreases the blood supply to the heart. In addition, extra beats may be caused or aggravated by:  Excessive smoking.  Alcohol consumption.  Caffeine.  Certain medications  Some street drugs. SYMPTOMS   The sensation of feeling your heart skipping a beat (palpitations).  In many cases, the person may have no symptoms. SIGNS AND TESTS   A physical examination may show an occasional irregularity, but if the PVC beats do not happen often, they may not be found on physical exam.  Blood pressure is usually normal.  Other tests that may find extra beats of the heart are:  An EKG (electrocardiogram)  A Holter monitor which can monitor your heart over longer periods of time  An Angiogram (study of the heart arteries). TREATMENT  Usually extra heartbeats do not need treatment. The condition is treated only if symptoms are severe or if extra beats are very frequent or are causing problems. An underlying cause, if discovered, may also require treatment.  Treatment may also be needed if there may be a risk for other more serious cardiac arrhythmias.  PREVENTION   Moderation in caffeine, alcohol, and tobacco use may reduce the risk of ectopic heartbeats in some people.  Exercise often helps people who lead a sedentary (inactive) lifestyle. PROGNOSIS  PVC heartbeats are generally harmless and do not need treatment.  RISKS AND COMPLICATIONS   Ventricular tachycardia (occasionally).  There usually are no complications.  Other  arrhythmias (occasionally). SEEK IMMEDIATE MEDICAL CARE IF:   You feel palpitations that are frequent or continual.  You develop chest pain or other problems such as shortness of breath, sweating, or nausea and vomiting.  You become light-headed or faint (pass out).  You get worse or do not improve with treatment. Document Released: 03/08/2004 Document Revised: 10/14/2011 Document Reviewed: 09/18/2007 [employer] Patient Information 2014 [employer].  

## 2013-01-21 LAB — COMPLETE METABOLIC PANEL WITH GFR
AST: 15 U/L (ref 0–37)
Alkaline Phosphatase: 35 U/L — ABNORMAL LOW (ref 39–117)
GFR, Est Non African American: >89 mL/min
Glucose, Bld: 86 mg/dL (ref 70–99)
Potassium: 4.1 mEq/L (ref 3.5–5.3)
Sodium: 138 mEq/L (ref 135–145)
Total Bilirubin: 0.6 mg/dL (ref 0.3–1.2)
Total Protein: 6.9 g/dL (ref 6.0–8.3)

## 2013-01-21 LAB — LIPID PANEL
HDL: 52 mg/dL (ref >39)
LDL Cholesterol: 76 mg/dL (ref 0–99)
Total CHOL/HDL Ratio: 2.7 Ratio
VLDL: 11 mg/dL (ref 0–40)

## 2013-01-21 LAB — CBC
HCT: 39 % (ref 36.0–46.0)
MCV: 87.2 fL (ref 78.0–100.0)
RBC: 4.47 MIL/uL (ref 3.87–5.11)
WBC: 7.3 10*3/uL (ref 4.0–10.5)

## 2013-02-09 ENCOUNTER — Encounter: Payer: Self-pay | Admitting: *Deleted

## 2013-02-15 ENCOUNTER — Other Ambulatory Visit: Payer: Self-pay | Admitting: Physician Assistant

## 2013-02-25 DIAGNOSIS — R002 Palpitations: Secondary | ICD-10-CM | POA: Insufficient documentation

## 2013-03-18 ENCOUNTER — Ambulatory Visit (INDEPENDENT_AMBULATORY_CARE_PROVIDER_SITE_OTHER): Payer: BC Managed Care – PPO | Admitting: Family Medicine

## 2013-03-18 ENCOUNTER — Encounter: Payer: Self-pay | Admitting: Family Medicine

## 2013-03-18 VITALS — BP 121/83 | HR 78 | Temp 98.0°F | Wt 132.0 lb

## 2013-03-18 DIAGNOSIS — J45909 Unspecified asthma, uncomplicated: Secondary | ICD-10-CM

## 2013-03-18 DIAGNOSIS — R0602 Shortness of breath: Secondary | ICD-10-CM

## 2013-03-18 DIAGNOSIS — J4530 Mild persistent asthma, uncomplicated: Secondary | ICD-10-CM

## 2013-03-18 MED ORDER — FLUTICASONE PROPIONATE HFA 110 MCG/ACT IN AERO: 2.0000 | INHALATION_SPRAY | Freq: Two times a day (BID) | RESPIRATORY_TRACT | Status: DC

## 2013-03-18 MED ORDER — ALBUTEROL SULFATE HFA 108 (90 BASE) MCG/ACT IN AERS: 2.0000 | INHALATION_SPRAY | Freq: Four times a day (QID) | RESPIRATORY_TRACT | Status: DC | PRN

## 2013-03-18 NOTE — Progress Notes (Signed)
CC: Shirley Ruiz is a 27 y.o. female is here for Shortness of Breath   Subjective: HPI:  Patient complains of chest tightness and shortness of breath that is mild to moderate in severity it occurs on a daily basis it has been worsening over the past 2 weeks. It seems to be worse in the evening almost absent in the morning. Slightly improved with albuterol however only temporarily. There has been wheezing associated with this. There is been no change to her environment, she does carry a diagnosis of asthma she is a nonsmoker.. she's a history of palpitations is currently being worked up awaiting results of Holter monitor she notices her breathing symptoms are only present in between episodes of palpitations however she has had some lightheadedness with palpitations. She denies cough, fever, chills, nausea, vomiting, confusion, motor or sensory disturbances. She is awoken once the last week to use albuterol she is using albuterol at least 2 times a day.   Review Of Systems Outlined In HPI  Past Medical History  Diagnosis Date  . Mental disorder     H/O depression  . Asthma   . Abnormal Pap smear 6/10    .HGSIL, Colpo and Cryo  . Diabetes in pregnancy   . Intervertebral disc protrusion      Family History  Problem Relation Age of Onset  . Depression Mother   . Hypertension Mother   . Heart disease Mother   . Depression Maternal Grandmother   . Heart attack Father   . Heart disease Maternal Grandfather      History  Substance Use Topics  . Smoking status: Never Smoker   . Smokeless tobacco: Never Used  . Alcohol Use: Yes     Comment: occasionally as a social drinker     Objective: Filed Vitals:   03/18/13 0825  BP: 121/83  Pulse: 78  Temp: 98 F (36.7 C)    General: Alert and Oriented, No Acute Distress HEENT: Pupils equal, round, reactive to light. Conjunctivae clear.  External ears unremarkable, canals clear with intact TMs with appropriate landmarks.  Middle ear  appears open without effusion. Pink inferior turbinates.  Moist mucous membranes, pharynx without inflammation nor lesions.  Neck supple without palpable lymphadenopathy nor abnormal masses. Lungs: Mildly distant breath sounds no wheezing no rhonchi nor rales no signs of consolidation Cardiac: Regular rate and rhythm. Normal S1/S2.  No murmurs, rubs, nor gallops.   Extremities: No peripheral edema.  Strong peripheral pulses.  Mental Status: No depression, anxiety, nor agitation. Skin: Warm and dry.  Assessment & Plan: Shirley Ruiz was seen today for shortness of breath.  Diagnoses and associated orders for this visit:  Asthma, mild persistent - albuterol (PROVENTIL HFA;VENTOLIN HFA) 108 (90 BASE) MCG/ACT inhaler; Inhale 2 puffs into the lungs every 6 (six) hours as needed for wheezing. - fluticasone (FLOVENT HFA) 110 MCG/ACT inhaler; Inhale 2 puffs into the lungs 2 (two) times daily.    Discuss my suspicion of poorly controlled mild to moderate persistent asthma I would like her to start on an inhaled corticosteroid she was given fluticasone, joint agreement made to avoid prednisone at this time, I've asked her to followup with her PCP in one week if not improved otherwise in one month.  Return if symptoms worsen or fail to improve.

## 2013-03-29 ENCOUNTER — Other Ambulatory Visit: Payer: Self-pay | Admitting: Physician Assistant

## 2013-05-28 ENCOUNTER — Ambulatory Visit (INDEPENDENT_AMBULATORY_CARE_PROVIDER_SITE_OTHER): Payer: BC Managed Care – PPO | Admitting: Physician Assistant

## 2013-05-28 ENCOUNTER — Encounter: Payer: Self-pay | Admitting: Physician Assistant

## 2013-05-28 VITALS — BP 147/94 | HR 89 | Wt 142.0 lb

## 2013-05-28 DIAGNOSIS — F410 Panic disorder [episodic paroxysmal anxiety] without agoraphobia: Secondary | ICD-10-CM

## 2013-05-28 DIAGNOSIS — F4323 Adjustment disorder with mixed anxiety and depressed mood: Secondary | ICD-10-CM

## 2013-05-28 MED ORDER — SERTRALINE HCL 50 MG PO TABS: ORAL_TABLET | ORAL | Status: DC

## 2013-05-28 MED ORDER — FLUTICASONE PROPIONATE 50 MCG/ACT NA SUSP: 2.0000 | Freq: Every day | NASAL | Status: DC

## 2013-05-28 MED ORDER — BUPROPION HCL ER (XL) 300 MG PO TB24: 300.0000 mg | ORAL_TABLET | Freq: Every day | ORAL | Status: DC

## 2013-05-28 MED ORDER — CLONAZEPAM 0.5 MG PO TABS: 0.5000 mg | ORAL_TABLET | Freq: Two times a day (BID) | ORAL | Status: DC | PRN

## 2013-05-28 NOTE — Patient Instructions (Signed)
Increase wellbutrin and added zoloft.

## 2013-05-30 DIAGNOSIS — F410 Panic disorder [episodic paroxysmal anxiety] without agoraphobia: Secondary | ICD-10-CM | POA: Insufficient documentation

## 2013-05-30 DIAGNOSIS — F4323 Adjustment disorder with mixed anxiety and depressed mood: Secondary | ICD-10-CM | POA: Insufficient documentation
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
## 2013-07-09 ENCOUNTER — Ambulatory Visit: Payer: BC Managed Care – PPO | Admitting: Physician Assistant

## 2013-07-09 DIAGNOSIS — Z0289 Encounter for other administrative examinations: Secondary | ICD-10-CM

## 2013-07-30 ENCOUNTER — Other Ambulatory Visit: Payer: Self-pay | Admitting: Physician Assistant

## 2013-08-10 ENCOUNTER — Other Ambulatory Visit: Payer: Self-pay | Admitting: Physician Assistant

## 2013-10-08 ENCOUNTER — Other Ambulatory Visit: Payer: Self-pay | Admitting: *Deleted

## 2013-10-08 MED ORDER — CLONAZEPAM 0.5 MG PO TABS: ORAL_TABLET | ORAL | Status: DC

## 2013-10-27 ENCOUNTER — Other Ambulatory Visit: Payer: Self-pay | Admitting: Physician Assistant

## 2013-10-27 MED ORDER — CLONAZEPAM 0.5 MG PO TABS: ORAL_TABLET | ORAL | Status: DC

## 2014-01-17 ENCOUNTER — Other Ambulatory Visit: Payer: Self-pay | Admitting: Physician Assistant

## 2014-03-14 ENCOUNTER — Telehealth: Payer: Self-pay | Admitting: *Deleted

## 2014-03-14 NOTE — Telephone Encounter (Signed)
Pt called requesting records of Hep B and HPV. Called her back and lvm informing her that there was no records of her having those done here at our office.Loralee PacasBarkley, Lisaann Atha WinstonLynetta

## 2014-04-07 ENCOUNTER — Encounter: Payer: Self-pay | Admitting: Obstetrics & Gynecology

## 2014-04-07 ENCOUNTER — Ambulatory Visit (INDEPENDENT_AMBULATORY_CARE_PROVIDER_SITE_OTHER): Payer: BC Managed Care – PPO | Admitting: Obstetrics & Gynecology

## 2014-04-07 VITALS — BP 134/80 | HR 55 | Resp 16 | Ht 62.0 in | Wt 142.0 lb

## 2014-04-07 DIAGNOSIS — Z113 Encounter for screening for infections with a predominantly sexual mode of transmission: Secondary | ICD-10-CM

## 2014-04-07 DIAGNOSIS — Z01419 Encounter for gynecological examination (general) (routine) without abnormal findings: Secondary | ICD-10-CM

## 2014-04-07 DIAGNOSIS — Z124 Encounter for screening for malignant neoplasm of cervix: Secondary | ICD-10-CM | POA: Diagnosis not present

## 2014-04-07 NOTE — Progress Notes (Signed)
  Subjective:     Shirley Ruiz is a 28 y.o. female here for a routine exam.  Current complaints: intermittant right sided pelvic apin, similar to pain in 2013 when she ad a simple cyst.  Pt will contact us if pain continues longer than 3 weeks.  .  Personal health questionnaire reviewed: yes.   Gynecologic History No LMP recorded. Patient is not currently having periods (Reason: IUD). Contraception: IUD Last Pap: 2013. Results were: normal  Obstetric History OB History  Gravida Para Term Preterm AB SAB TAB Ectopic Multiple Living  # Outcome Date GA Lbr Len/2nd Weight Sex Delivery Anes PTL Lv  1 TRM                The following portions of the patient's history were reviewed and updated as appropriate: allergies, current medications, past family history, past medical history, past social history, past surgical history and problem list.  Review of Systems Pertinent items are noted in HPI.    Objective:      Filed Vitals:   04/07/14 1330  BP: 134/80  Pulse: 55  Resp: 16  Height:  (1.575 m)  Weight: 142 lb (64.411 kg)   Vitals:  WNL General appearance: alert, cooperative and no distress Head: Normocephalic, without obvious abnormality, atraumatic Eyes: negative Throat: lips, mucosa, and tongue normal; teeth and gums normal Lungs: clear to auscultation bilaterally Breasts: normal appearance, no masses or tenderness, No nipple retraction or dimpling, No nipple discharge or bleeding Heart: regular rate and rhythm Abdomen: soft, non-tender; bowel sounds normal; no masses,  no organomegaly  Pelvic:  External Genitalia:  Tanner V, no lesion Urethra:  No prolpase Vagina:  Pink, normal rugae, no blood or discharge Cervix:  No CMT, no lesion Uterus:  Normal size and contour, non tender Adnexa:  Normal, no masses, non tender  Extremities: no edema, redness or tenderness in the calves or thighs Skin: no lesions or rash Lymph nodes: Axillary adenopathy:  none        Assessment:    Healthy female exam.    Plan:    Education reviewed: self breast exams. Contraception: IUD. Follow up in: 1 year. pap today Pt considering pregnancy next year.   Pt knows to start PNV 1 month prior to IUD removal.

## 2014-04-07 NOTE — Addendum Note (Signed)
Addended by: Granville Lewis on: 04/07/2014 02:02 PM   Modules accepted: Orders

## 2014-04-12 LAB — CYTOLOGY - PAP

## 2014-04-29 ENCOUNTER — Encounter: Payer: Self-pay | Admitting: Family Medicine

## 2014-04-29 ENCOUNTER — Ambulatory Visit (INDEPENDENT_AMBULATORY_CARE_PROVIDER_SITE_OTHER): Payer: BC Managed Care – PPO | Admitting: Family Medicine

## 2014-04-29 VITALS — BP 122/71 | HR 72 | Wt 138.0 lb

## 2014-04-29 DIAGNOSIS — Z23 Encounter for immunization: Secondary | ICD-10-CM | POA: Diagnosis not present

## 2014-04-29 DIAGNOSIS — F4323 Adjustment disorder with mixed anxiety and depressed mood: Secondary | ICD-10-CM | POA: Diagnosis not present

## 2014-04-29 DIAGNOSIS — Z0000 Encounter for general adult medical examination without abnormal findings: Secondary | ICD-10-CM

## 2014-04-29 MED ORDER — CLONAZEPAM 0.5 MG PO TABS: ORAL_TABLET | ORAL | Status: DC

## 2014-04-29 MED ORDER — BUPROPION HCL ER (XL) 300 MG PO TB24: ORAL_TABLET | ORAL | Status: DC

## 2014-04-29 NOTE — Patient Instructions (Signed)
Keep up a regular exercise program and make sure you are eating a healthy diet Try to eat 4 servings of dairy a day, or if you are lactose intolerant take a calcium with vitamin D daily.  Your vaccines are up to date.   
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
## 2014-04-29 NOTE — Assessment & Plan Note (Signed)
Restart wellbutrin and refilled her clonazepam. Use sparingly. F/U in 6 months.

## 2014-06-06 ENCOUNTER — Encounter: Payer: Self-pay | Admitting: Family Medicine

## 2014-06-17 ENCOUNTER — Ambulatory Visit: Payer: BC Managed Care – PPO | Admitting: Family Medicine

## 2014-07-25 ENCOUNTER — Ambulatory Visit (INDEPENDENT_AMBULATORY_CARE_PROVIDER_SITE_OTHER): Payer: BC Managed Care – PPO | Admitting: Family Medicine

## 2014-07-25 ENCOUNTER — Encounter: Payer: Self-pay | Admitting: Family Medicine

## 2014-07-25 VITALS — BP 137/70 | HR 70 | Temp 98.0°F | Wt 149.0 lb

## 2014-07-25 DIAGNOSIS — N3000 Acute cystitis without hematuria: Secondary | ICD-10-CM

## 2014-07-25 DIAGNOSIS — R300 Dysuria: Secondary | ICD-10-CM | POA: Diagnosis not present

## 2014-07-25 LAB — POCT URINALYSIS DIPSTICK
Bilirubin, UA: NEGATIVE
Glucose, UA: NEGATIVE
KETONES UA: NEGATIVE
Nitrite, UA: POSITIVE
PH UA: 7
PROTEIN UA: NEGATIVE
Spec Grav, UA: 1.015
Urobilinogen, UA: 0.2

## 2014-07-25 MED ORDER — BUPROPION HCL ER (XL) 300 MG PO TB24: ORAL_TABLET | ORAL | Status: DC

## 2014-07-25 MED ORDER — CIPROFLOXACIN HCL 500 MG PO TABS: 500.0000 mg | ORAL_TABLET | Freq: Two times a day (BID) | ORAL | Status: AC

## 2014-07-25 NOTE — Progress Notes (Signed)
   Subjective:    Patient ID: Shirley Ruiz, female    DOB: [birth year], 28 y.o.   MRN: [id number]  HPI Urinary sxs began friday, lower back pain, pain is worse on R side, lower back pain, she has noticed that her urine is cloudy and odorous she ahs been drinking cranberry juice and taking probiotic/cranberry tabs.  No vaginal d/c.  No fever, chills or sweats.  No recent antibiotics. Not at high risk as no recent history of instrumentation or catheterization etc    Review of Systems     Objective:   Physical Exam  Constitutional: She is oriented to person, place, and time. She appears well-developed and well-nourished.  HENT:  Head: Normocephalic and atraumatic.  Musculoskeletal:  No CVA tenderness. Nontender suprapubically and over the abdomen.  Neurological: She is alert and oriented to person, place, and time.  Skin: Skin is warm.  Psychiatric: She has a normal mood and affect. Her behavior is normal.          Assessment & Plan:  Urinary tract infection-will treat with Cipro 3 days. Make sure drinking plenty of fluids. Can use Azo-Standard over-the-counter for symptom relief. If not significantly improved in 5-7 days and please call us back. No culture sent.

## 2014-07-25 NOTE — Patient Instructions (Signed)

## 2014-08-03 ENCOUNTER — Other Ambulatory Visit: Payer: Self-pay | Admitting: Family Medicine

## 2014-08-31 ENCOUNTER — Other Ambulatory Visit: Payer: Self-pay | Admitting: Family Medicine

## 2014-09-23 ENCOUNTER — Other Ambulatory Visit: Payer: Self-pay

## 2014-09-23 MED ORDER — BUPROPION HCL ER (XL) 300 MG PO TB24: ORAL_TABLET | ORAL | Status: DC

## 2014-09-23 MED ORDER — CLONAZEPAM 0.5 MG PO TABS: 0.5000 mg | ORAL_TABLET | Freq: Two times a day (BID) | ORAL | Status: DC | PRN

## 2014-09-30 ENCOUNTER — Other Ambulatory Visit: Payer: Self-pay | Admitting: Physician Assistant

## 2014-09-30 ENCOUNTER — Other Ambulatory Visit: Payer: Self-pay | Admitting: Family Medicine

## 2014-10-03 ENCOUNTER — Telehealth: Payer: Self-pay

## 2014-10-03 NOTE — Telephone Encounter (Signed)
Sent in refill. Patient needs to come in for follow up appointment.

## 2014-10-05 ENCOUNTER — Other Ambulatory Visit: Payer: Self-pay

## 2014-10-05 NOTE — Telephone Encounter (Signed)
Refill to early. She can only receive 30 day supply until her next follow up.

## 2014-10-07 ENCOUNTER — Telehealth: Payer: Self-pay | Admitting: *Deleted

## 2014-10-07 MED ORDER — FLUOXETINE HCL 20 MG PO TABS: 20.0000 mg | ORAL_TABLET | Freq: Every day | ORAL | Status: DC

## 2014-10-07 NOTE — Telephone Encounter (Signed)
OK will start fluoxetine. Take one a day and f/u with me in 3-4 weeks.

## 2014-10-07 NOTE — Addendum Note (Signed)
Addended by: Nani GasserMETHENEY, CATHERINE D on: 10/07/2014 11:53 AM   Modules accepted: Orders

## 2014-10-07 NOTE — Telephone Encounter (Signed)
Dr Linford ArnoldMetheney  Patient called in stating that you had given her clonazepam to help with some extra stress & anxiety that she has been having. Patient stated that it isn't working & wonder if she could get something stronger. She says that her court dates are coming up @ the end of the month.

## 2014-10-07 NOTE — Telephone Encounter (Signed)
Called patient & informed her that Dr Linford ArnoldMetheney sent in RX for Fluoxetine & for her to f/u 3/4 weeks

## 2014-10-10 ENCOUNTER — Encounter: Payer: Self-pay | Admitting: Family Medicine

## 2014-10-11 ENCOUNTER — Telehealth: Payer: Self-pay | Admitting: *Deleted

## 2014-10-11 ENCOUNTER — Encounter: Payer: Self-pay | Admitting: Family Medicine

## 2014-10-11 NOTE — Telephone Encounter (Signed)
Please call pt: we did send her rx to [facility] but they didn't fill since not really a chronic 90 day med. [facility]

## 2014-10-11 NOTE — Telephone Encounter (Signed)
[facility] in [facility]'s chart and [facility] [address] states they have not filled a clonzepam rx for pt since Jan. Also called [facility] and they have not shipped and filled a clonezepam rx for this pt in the last month

## 2014-10-12 ENCOUNTER — Other Ambulatory Visit: Payer: Self-pay

## 2014-10-12 MED ORDER — CLONAZEPAM 0.5 MG PO TABS: 0.5000 mg | ORAL_TABLET | Freq: Two times a day (BID) | ORAL | Status: DC | PRN

## 2014-10-12 NOTE — Telephone Encounter (Signed)
rx sent and faxed 

## 2014-11-03 ENCOUNTER — Encounter: Payer: Self-pay | Admitting: Family Medicine

## 2014-11-03 ENCOUNTER — Ambulatory Visit (INDEPENDENT_AMBULATORY_CARE_PROVIDER_SITE_OTHER): Payer: BLUE CROSS/BLUE SHIELD | Admitting: Family Medicine

## 2014-11-03 VITALS — BP 117/71 | HR 74 | Ht 62.0 in | Wt 146.0 lb

## 2014-11-03 DIAGNOSIS — G43909 Migraine, unspecified, not intractable, without status migrainosus: Secondary | ICD-10-CM

## 2014-11-03 DIAGNOSIS — G4700 Insomnia, unspecified: Secondary | ICD-10-CM

## 2014-11-03 DIAGNOSIS — F4323 Adjustment disorder with mixed anxiety and depressed mood: Secondary | ICD-10-CM | POA: Diagnosis not present

## 2014-11-03 MED ORDER — ZOLPIDEM TARTRATE 5 MG PO TABS: 5.0000 mg | ORAL_TABLET | Freq: Every evening | ORAL | Status: DC | PRN

## 2014-11-03 MED ORDER — ONDANSETRON HCL 8 MG PO TABS: 8.0000 mg | ORAL_TABLET | Freq: Three times a day (TID) | ORAL | Status: DC | PRN

## 2014-11-03 NOTE — Patient Instructions (Addendum)
Decrease dose to half a tab a day for one week, then half a tab every other day for one week and then can stop.  Insomnia Insomnia is frequent trouble falling and/or staying asleep. Insomnia can be a long term problem or a short term problem. Both are common. Insomnia can be a short term problem when the wakefulness is related to a certain stress or worry. Long term insomnia is often related to ongoing stress during waking hours and/or poor sleeping habits. Overtime, sleep deprivation itself can make the problem worse. Every little thing feels more severe because you are overtired and your ability to cope is decreased. CAUSES   Stress, anxiety, and depression.  Poor sleeping habits.  Distractions such as TV in the bedroom.  Naps close to bedtime.  Engaging in emotionally charged conversations before bed.  Technical reading before sleep.  Alcohol and other sedatives. They may make the problem worse. They can hurt normal sleep patterns and normal dream activity.  Stimulants such as caffeine for several hours prior to bedtime.  Pain syndromes and shortness of breath can cause insomnia.  Exercise late at night.  Changing time zones may cause sleeping problems (jet lag). It is sometimes helpful to have someone observe your sleeping patterns. They should look for periods of not breathing during the night (sleep apnea). They should also look to see how long those periods last. If you live alone or observers are uncertain, you can also be observed at a sleep clinic where your sleep patterns will be professionally monitored. Sleep apnea requires a checkup and treatment. Give your caregivers your medical history. Give your caregivers observations your family has made about your sleep.  SYMPTOMS   Not feeling rested in the morning.  Anxiety and restlessness at bedtime.  Difficulty falling and staying asleep. TREATMENT   Your caregiver may prescribe treatment for an underlying medical  disorders. Your caregiver can give advice or help if you are using alcohol or other drugs for self-medication. Treatment of underlying problems will usually eliminate insomnia problems.  Medications can be prescribed for short time use. They are generally not recommended for lengthy use.  Over-the-counter sleep medicines are not recommended for lengthy use. They can be habit forming.  You can promote easier sleeping by making lifestyle changes such as:  Using relaxation techniques that help with breathing and reduce muscle tension.  Exercising earlier in the day.  Changing your diet and the time of your last meal. No night time snacks.  Establish a regular time to go to bed.  Counseling can help with stressful problems and worry.  Soothing music and white noise may be helpful if there are background noises you cannot remove.  Stop tedious detailed work at least one hour before bedtime. HOME CARE INSTRUCTIONS   Keep a diary. Inform your caregiver about your progress. This includes any medication side effects. See your caregiver regularly. Take note of:  Times when you are asleep.  Times when you are awake during the night.  The quality of your sleep.  How you feel the next day. This information will help your caregiver care for you.  Get out of bed if you are still awake after 15 minutes. Read or do some quiet activity. Keep the lights down. Wait until you feel sleepy and go back to bed.  Keep regular sleeping and waking hours. Avoid naps.  Exercise regularly.  Avoid distractions at bedtime. Distractions include watching television or engaging in any intense or detailed activity like  attempting to balance the household checkbook.  Develop a bedtime ritual. Keep a familiar routine of bathing, brushing your teeth, climbing into bed at the same time each night, listening to soothing music. Routines increase the success of falling to sleep faster.  Use relaxation techniques.  This can be using breathing and muscle tension release routines. It can also include visualizing peaceful scenes. You can also help control troubling or intruding thoughts by keeping your mind occupied with boring or repetitive thoughts like the old concept of counting sheep. You can make it more creative like imagining planting one beautiful flower after another in your backyard garden.  During your day, work to eliminate stress. When this is not possible use some of the previous suggestions to help reduce the anxiety that accompanies stressful situations. MAKE SURE YOU:   Understand these instructions.  Will watch your condition.  Will get help right away if you are not doing well or get worse. Document Released: 07/19/2000 Document Revised: 10/14/2011 Document Reviewed: 08/19/2007 [facility] Patient Information 2015 [employer] [place]. This information is not intended to replace advice given to you by your health care provider. Make sure you discuss any questions you have with your health care provider.

## 2014-11-03 NOTE — Progress Notes (Signed)
   Subjective:    Patient ID: Shirley Ruiz, female    DOB: [birth year], 29 y.o.   MRN: [id number]  HPI Follow-up adjustment disorder-gad-7=6 phq-9=9 pt is considering pregnancy and would like to discuss weaning off fluoextine. she no longer takes wellbutrin due to it causing increased PVC's. She has a Mirena and currently but would like to consider having it taken out in the next few months to work on becoming pregnant. Says not just having a really hard time sleeping. Says her stressors have gotten much better. She finally got custody of her daughter.   Migraine headaches-she is asking if Zofran can be increased to 8mg  because she has to take 2 of the 4 mg tabs whenever her migraines flare  Review of Systems     Objective:   Physical Exam  Constitutional: She is oriented to person, place, and time. She appears well-developed and well-nourished.  HENT:  Head: Normocephalic and atraumatic.  Cardiovascular: Normal rate, regular rhythm and normal heart sounds.   Pulmonary/Chest: Effort normal and breath sounds normal.  Neurological: She is alert and oriented to person, place, and time.  Skin: Skin is warm and dry.  Psychiatric: She has a normal mood and affect. Her behavior is normal.          Assessment & Plan:  Adjustment disorder with mixed anxiety and depressed mood-PHQ 9 score of 9 today and Gad 7 score of 6 area and she rates her symptoms is somewhat difficult. Now that most of her stressors have decreased and situations have changed would really like to wean off the medication. I wrote down a taper for her. Certainly if she feels like her mood is starting to go down then please let us know. I did discuss with her that sometimes we do continue these medications into pregnancy if needed.  Migraine headaches-we'll increase Zofran 8 mg. We'll need to discuss this with her OB/GYN. They may not want her taking this during her pregnancy except rarely.  Insomnia- Discussed options.  Discussed sleep hygiene. Will put her on a small quantity of Ambien for the next couple weeks to try to reset her sleep clock. Reminded her that this cannot be taken during pregnancy as well as other sleep aids that are on the market. Certainly she could try some melatonin if she would like and focus on sleep hygiene. I think to now that her stressors have decreased this will make a big difference.

## 2014-12-27 ENCOUNTER — Ambulatory Visit: Payer: Self-pay | Admitting: Obstetrics & Gynecology

## 2015-01-03 ENCOUNTER — Encounter: Payer: Self-pay | Admitting: Obstetrics & Gynecology

## 2015-01-03 ENCOUNTER — Ambulatory Visit (INDEPENDENT_AMBULATORY_CARE_PROVIDER_SITE_OTHER): Payer: BLUE CROSS/BLUE SHIELD | Admitting: Obstetrics & Gynecology

## 2015-01-03 VITALS — BP 135/82 | HR 70 | Ht 63.0 in | Wt 151.0 lb

## 2015-01-03 DIAGNOSIS — Z30432 Encounter for removal of intrauterine contraceptive device: Secondary | ICD-10-CM

## 2015-01-03 DIAGNOSIS — Z139 Encounter for screening, unspecified: Secondary | ICD-10-CM

## 2015-01-03 NOTE — Progress Notes (Signed)
   Subjective:    Patient ID: Shirley Ruiz, female    DOB: November 09, 1985, 29 y.o.   MRN: [id number]  HPI  Patient her for IUD removal.  Patient has been on prenatal vitamins.  She has depression controlled and weaned off meds.  Patient also requesting CF testing today.   Review of Systems  Constitutional: Negative.   Gastrointestinal: Negative.   Genitourinary: Negative.   Psychiatric/Behavioral: Negative.        Objective:   Physical Exam  Constitutional: She is oriented to person, place, and time. She appears well-developed and well-nourished. No distress.  HENT:  Head: Normocephalic and atraumatic.  Eyes: Conjunctivae are normal.  Pulmonary/Chest: Effort normal.  Abdominal: Soft. Bowel sounds are normal. She exhibits no distension and no mass. There is no tenderness. There is no rebound and no guarding.  Genitourinary: Vagina normal and uterus normal. No vaginal discharge found.  Musculoskeletal: She exhibits no edema.  Neurological: She is alert and oriented to person, place, and time.  Skin: Skin is warm and dry.  Psychiatric: She has a normal mood and affect.  Vitals reviewed.  Filed Vitals:   01/03/15 1501  BP: 135/82  Pulse: 70  Height: 5\' 3"  (1.6 m)  Weight: 151 lb (68.493 kg)          Assessment & Plan:  29 yo for IUD removal and preconceptual counseling.  1-IUD removed without issue 2-CF testing 3-Continue prenatal vitamins 4-Pap up to date 5-Monitor for signs of depression.

## 2015-01-06 LAB — CYSTIC FIBROSIS DIAGNOSTIC STUDY

## 2015-01-09 ENCOUNTER — Telehealth: Payer: Self-pay | Admitting: *Deleted

## 2015-01-09 NOTE — Telephone Encounter (Signed)
Pt notified of neg CF preconceptual testing

## 2015-01-09 NOTE — Telephone Encounter (Signed)
-----   Message from Lesly DukesKelly H Leggett, MD sent at 01/08/2015  7:28 PM EDT ----- Call the patient and let them know their lab results are normal.  Thanks!!

## 2015-02-17 ENCOUNTER — Encounter: Payer: Self-pay | Admitting: Obstetrics & Gynecology

## 2015-02-20 ENCOUNTER — Other Ambulatory Visit: Payer: Self-pay | Admitting: Family Medicine

## 2015-02-20 ENCOUNTER — Encounter: Payer: Self-pay | Admitting: Family Medicine

## 2015-02-20 ENCOUNTER — Telehealth: Payer: Self-pay | Admitting: *Deleted

## 2015-02-20 MED ORDER — CITALOPRAM HYDROBROMIDE 20 MG PO TABS: ORAL_TABLET | ORAL | Status: DC

## 2015-02-20 NOTE — Telephone Encounter (Signed)
LM for pt to call office to f/u on SSRI's

## 2015-02-20 NOTE — Telephone Encounter (Signed)
-----   Message from Lesly DukesKelly H Leggett, MD sent at 02/20/2015  3:22 PM EDT ----- Pt sent me email about her depression coming back.  Can you call her and see what she used to be on.  If it is an SSRI, can restart.  If tryng to become pregnant, then Zoloft 50mg  is best.

## 2015-03-23 ENCOUNTER — Encounter: Payer: Self-pay | Admitting: Obstetrics & Gynecology

## 2015-03-23 ENCOUNTER — Other Ambulatory Visit: Payer: Self-pay | Admitting: *Deleted

## 2015-03-23 MED ORDER — CITALOPRAM HYDROBROMIDE 20 MG PO TABS: 20.0000 mg | ORAL_TABLET | Freq: Every day | ORAL | Status: DC

## 2015-03-24 ENCOUNTER — Telehealth: Payer: Self-pay | Admitting: *Deleted

## 2015-03-24 ENCOUNTER — Other Ambulatory Visit: Payer: Self-pay | Admitting: Obstetrics & Gynecology

## 2015-03-24 MED ORDER — NORGESTIMATE-ETH ESTRADIOL 0.25-35 MG-MCG PO TABS: 1.0000 | ORAL_TABLET | Freq: Every day | ORAL | Status: DC

## 2015-03-24 NOTE — Telephone Encounter (Signed)
Pt wanted to start OCP and per Dr Penne Lash a [facility] was [facility]  If she starts her cycle before the RX arrives she will call me and I will send in 1 pack to local pharmacy

## 2015-03-28 ENCOUNTER — Other Ambulatory Visit: Payer: Self-pay | Admitting: Family Medicine

## 2015-03-28 ENCOUNTER — Other Ambulatory Visit: Payer: Self-pay | Admitting: *Deleted

## 2015-03-28 MED ORDER — CLONAZEPAM 0.5 MG PO TABS: 0.5000 mg | ORAL_TABLET | Freq: Two times a day (BID) | ORAL | Status: DC | PRN

## 2015-03-28 MED ORDER — CITALOPRAM HYDROBROMIDE 20 MG PO TABS: 20.0000 mg | ORAL_TABLET | Freq: Every day | ORAL | Status: DC

## 2015-03-28 NOTE — Telephone Encounter (Signed)
Pt is requesting refill on Clonazepam Tablet 0.5mg  be sent to [facility]. This Rx was marked as a completed course, but will route to PCP for review.

## 2015-03-28 NOTE — Telephone Encounter (Signed)
Pt states she is not pregnant and actually got put on oral BC via OB/GYN. Pt does request this Rx go to the mail order pharmacy, only wants the one month supply as she plans to discuss this Rx with PCP at upcoming appt.

## 2015-03-28 NOTE — Telephone Encounter (Signed)
Is she still trying to get pregnant. She can't take this if pregnant. If she is not pregnant then ok to send 30 tabs.

## 2015-04-07 NOTE — Telephone Encounter (Signed)
-----   Message from Shirley Dukes, MD sent at 04/07/2015  5:32 AM EDT ----- Here is the audit train from an electronic message.  My message to her was never read.  Can you call patient and tell her about the message and also do what ever needs to be done to get her some birth control pills.  Let me know what happened.  Thnks  Shirley Dukes, MD   To Shirlyn Goltz   Sent 03/24/2015 7:01 AM    Hi Morrie Sheldon,   I sent the prescription to your mail order pharmacy. If you need a pack now, please call the office and Mervyn Gay or Angelica Chessman can provide you with this. I prescribed Ortho Cyclen (generic). If there is a specific brand you wanted, let me know.     Previous Messages    ----- Message -----   FromShirlyn Goltz   Sent: 03/23/2015 12:00 PM EDT    To: Shirley Ruiz., MD  Subject: Non-Urgent Medical Question   Hey, sorry I didn't get back to you sooner about the anti-depressant, Dr. Linford Arnold ended up calling me in Citalopram which is helping. My husband and I have decided to hold of maybe a few months on conceiving so I was wondering if you could call me in an rx for birth control pills until we decide we're ready?   Thanks!    Audit Building surveyor Last Read On Merlie Noga Not Read

## 2015-04-07 NOTE — Telephone Encounter (Signed)
I called pt cell phone and LMOM for her to rtn call if she still needed OCP.

## 2015-04-24 ENCOUNTER — Encounter: Payer: Self-pay | Admitting: Family Medicine

## 2015-04-24 ENCOUNTER — Ambulatory Visit (INDEPENDENT_AMBULATORY_CARE_PROVIDER_SITE_OTHER): Payer: BLUE CROSS/BLUE SHIELD | Admitting: Family Medicine

## 2015-04-24 ENCOUNTER — Other Ambulatory Visit: Payer: Self-pay | Admitting: *Deleted

## 2015-04-24 VITALS — BP 116/70 | HR 55 | Temp 98.7°F | Ht 62.0 in | Wt 152.0 lb

## 2015-04-24 DIAGNOSIS — R5383 Other fatigue: Secondary | ICD-10-CM

## 2015-04-24 DIAGNOSIS — R82998 Other abnormal findings in urine: Secondary | ICD-10-CM

## 2015-04-24 DIAGNOSIS — Z0000 Encounter for general adult medical examination without abnormal findings: Secondary | ICD-10-CM

## 2015-04-24 DIAGNOSIS — F4323 Adjustment disorder with mixed anxiety and depressed mood: Secondary | ICD-10-CM | POA: Diagnosis not present

## 2015-04-24 DIAGNOSIS — R0602 Shortness of breath: Secondary | ICD-10-CM

## 2015-04-24 DIAGNOSIS — R7989 Other specified abnormal findings of blood chemistry: Secondary | ICD-10-CM

## 2015-04-24 DIAGNOSIS — G43009 Migraine without aura, not intractable, without status migrainosus: Secondary | ICD-10-CM | POA: Diagnosis not present

## 2015-04-24 MED ORDER — ONDANSETRON HCL 8 MG PO TABS: 8.0000 mg | ORAL_TABLET | Freq: Three times a day (TID) | ORAL | Status: DC | PRN

## 2015-04-24 NOTE — Patient Instructions (Signed)
Keep up a regular exercise program and make sure you are eating a healthy diet Try to eat 4 servings of dairy a day, or if you are lactose intolerant take a calcium with vitamin D daily.  Your vaccines are up to date.   Try lower dose on your zofran on half a tab.    [employer] Pal - smart phone appl

## 2015-04-24 NOTE — Progress Notes (Signed)
Subjective:     Shirley Ruiz is a 29 y.o. female and is here for a comprehensive physical exam. The patient reports problems - concern about her migraines.  Getting about 1 per months on average.  If wakes up with a migarine she will take her imitrex and zofran it is too sedating.  She is taking 8 mg zofran.     adjustment disorder with mixed anxiety and depression-overall doing well. Work has been very stressful. They have been very short staffed and this is caused a lot of increased anxiety and stress. She did call for refill on her clonazepam last week. She does have a history of panic attacks.  Social History   Social History  . Marital Status: Divorced    Spouse Name: N/A  . Number of Children: N/A  . Years of Education: N/A   Occupational History  . Not on file.   Social History Main Topics  . Smoking status: Never Smoker   . Smokeless tobacco: Never Used  . Alcohol Use: Yes     Comment: occasionally as a social drinker  . Drug Use: No  . Sexual Activity:    Partners: Male    Birth Control/ Protection: IUD   Other Topics Concern  . Not on file   Social History Narrative   Health Maintenance  Topic Date Due  . INFLUENZA VACCINE  03/06/2015  . PAP SMEAR  04/07/2017  . TETANUS/TDAP  08/15/2021  . HIV Screening  Completed    The following portions of the patient's history were reviewed and updated as appropriate: allergies, current medications, past family history, past medical history, past social history, past surgical history and problem list.  Review of Systems A comprehensive review of systems was negative.   Objective:    BP 116/70 mmHg  Pulse 55  Temp(Src) 98.7 F (37.1 C)  Ht  (1.575 m)  Wt 152 lb (68.947 kg)  BMI 27.79 kg/m2  LMP 04/12/2015 General appearance: alert, cooperative and appears stated age Head: Normocephalic, without obvious abnormality, atraumatic Eyes: conj clear, EOMI, PEERLA Ears: normal TM's and external ear canals both  ears Nose: Nares normal. Septum midline. Mucosa normal. No drainage or sinus tenderness. Throat: lips, mucosa, and tongue normal; teeth and gums normal Neck: no adenopathy, no carotid bruit, no JVD, supple, symmetrical, trachea midline and thyroid not enlarged, symmetric, no tenderness/mass/nodules Back: symmetric, no curvature. ROM normal. No CVA tenderness. Lungs: clear to auscultation bilaterally Heart: regular rate and rhythm, S1, S2 normal, no murmur, click, rub or gallop Abdomen: soft, non-tender; bowel sounds normal; no masses,  no organomegaly Extremities: extremities normal, atraumatic, no cyanosis or edema Pulses: 2+ and symmetric Skin: Skin color, texture, turgor normal. No rashes or lesions Lymph nodes: Cervical adenopathy: nl and Axillary adenopathy: nl Neurologic: Alert and oriented X 3, normal strength and tone. Normal symmetric reflexes. Normal coordination and gait    Assessment:    Healthy female exam.      Plan:     See After Visit Summary for Counseling Recommendations  Keep up a regular exercise program and make sure you are eating a healthy diet Try to eat 4 servings of dairy a day, or if you are lactose intolerant take a calcium with vitamin D daily.  Your vaccines are up to date.    migraine headaches-since accommodation Imitrex and the Zofran is too sedating recommend that she decrease her Zofran to about 4 mg B half a tab. We cannot change her perception back to  4 mg tab which is what she used to be on. And see if this works a little bit better for her. She could also just home Zofran and just take the Imitrex by itself especially for those first morning headaches and see if that works well. Next   adjustment disorder with mixed anxiety and depression-doing well on citalopram. She did call for refill on her clonazepam last week. Continue to monitor carefully. And avoid excessive use of clonazepam. Continue use sparingly. Follow-up in 6 months.

## 2015-04-25 ENCOUNTER — Other Ambulatory Visit: Payer: Self-pay | Admitting: *Deleted

## 2015-04-25 DIAGNOSIS — E871 Hypo-osmolality and hyponatremia: Secondary | ICD-10-CM

## 2015-04-25 DIAGNOSIS — R748 Abnormal levels of other serum enzymes: Secondary | ICD-10-CM

## 2015-04-25 LAB — COMPLETE METABOLIC PANEL WITH GFR
ALBUMIN: 4.5 g/dL (ref 3.6–5.1)
ALK PHOS: 30 U/L — AB (ref 33–115)
ALT: 57 U/L — ABNORMAL HIGH (ref 6–29)
AST: 27 U/L (ref 10–30)
BUN: 20 mg/dL (ref 7–25)
CALCIUM: 9.4 mg/dL (ref 8.6–10.2)
CHLORIDE: 100 mmol/L (ref 98–110)
CO2: 25 mmol/L (ref 20–31)
Creat: 0.78 mg/dL (ref 0.50–1.10)
Glucose, Bld: 90 mg/dL (ref 65–99)
POTASSIUM: 4.5 mmol/L (ref 3.5–5.3)
Sodium: 133 mmol/L — ABNORMAL LOW (ref 135–146)
Total Bilirubin: 0.7 mg/dL (ref 0.2–1.2)
Total Protein: 7.4 g/dL (ref 6.1–8.1)

## 2015-04-25 LAB — LIPID PANEL
CHOL/HDL RATIO: 2.4 ratio (ref <=5.0)
CHOLESTEROL: 172 mg/dL (ref 125–200)
HDL: 72 mg/dL (ref >=46)
LDL Cholesterol: 72 mg/dL (ref <130)
TRIGLYCERIDES: 140 mg/dL (ref <150)
VLDL: 28 mg/dL (ref <30)

## 2015-04-26 ENCOUNTER — Encounter: Payer: Self-pay | Admitting: Family Medicine

## 2015-04-26 ENCOUNTER — Other Ambulatory Visit: Payer: Self-pay | Admitting: Family Medicine

## 2015-04-26 DIAGNOSIS — R0602 Shortness of breath: Secondary | ICD-10-CM

## 2015-05-04 ENCOUNTER — Ambulatory Visit (INDEPENDENT_AMBULATORY_CARE_PROVIDER_SITE_OTHER): Payer: BLUE CROSS/BLUE SHIELD

## 2015-05-04 DIAGNOSIS — R5383 Other fatigue: Secondary | ICD-10-CM

## 2015-05-04 DIAGNOSIS — R0602 Shortness of breath: Secondary | ICD-10-CM | POA: Diagnosis not present

## 2015-05-05 ENCOUNTER — Ambulatory Visit (HOSPITAL_BASED_OUTPATIENT_CLINIC_OR_DEPARTMENT_OTHER)
Admission: RE | Admit: 2015-05-05 | Discharge: 2015-05-05 | Disposition: A | Discharge status: Home / Self Care | Payer: BLUE CROSS/BLUE SHIELD | Source: Ambulatory Visit | Attending: Family Medicine | Admitting: Family Medicine

## 2015-05-05 ENCOUNTER — Encounter: Payer: Self-pay | Admitting: Family Medicine

## 2015-05-05 ENCOUNTER — Encounter (HOSPITAL_BASED_OUTPATIENT_CLINIC_OR_DEPARTMENT_OTHER): Payer: Self-pay

## 2015-05-05 DIAGNOSIS — R7989 Other specified abnormal findings of blood chemistry: Secondary | ICD-10-CM

## 2015-05-05 DIAGNOSIS — R0602 Shortness of breath: Secondary | ICD-10-CM | POA: Diagnosis not present

## 2015-05-05 LAB — CBC WITH DIFFERENTIAL/PLATELET
BASOS ABS: 0 10*3/uL (ref 0.0–0.1)
Basophils Relative: 0 % (ref 0–1)
Eosinophils Absolute: 0.1 10*3/uL (ref 0.0–0.7)
Eosinophils Relative: 1 % (ref 0–5)
HEMATOCRIT: 38.4 % (ref 36.0–46.0)
Hemoglobin: 12.6 g/dL (ref 12.0–15.0)
LYMPHS ABS: 2.2 10*3/uL (ref 0.7–4.0)
LYMPHS PCT: 32 % (ref 12–46)
MCH: 29.5 pg (ref 26.0–34.0)
MCHC: 32.8 g/dL (ref 30.0–36.0)
MCV: 89.9 fL (ref 78.0–100.0)
MPV: 10.4 fL (ref 8.6–12.4)
Monocytes Absolute: 0.3 10*3/uL (ref 0.1–1.0)
Monocytes Relative: 5 % (ref 3–12)
NEUTROS ABS: 4.3 10*3/uL (ref 1.7–7.7)
NEUTROS PCT: 62 % (ref 43–77)
Platelets: 247 10*3/uL (ref 150–400)
RBC: 4.27 MIL/uL (ref 3.87–5.11)
RDW: 12.5 % (ref 11.5–15.5)
WBC: 6.9 10*3/uL (ref 4.0–10.5)

## 2015-05-05 LAB — HEPATITIS PANEL, ACUTE
HCV Ab: NEGATIVE
HEP A IGM: NONREACTIVE
Hep B C IgM: NONREACTIVE
Hepatitis B Surface Ag: NEGATIVE

## 2015-05-05 LAB — VITAMIN B12: VITAMIN B 12: 492 pg/mL (ref 211–911)

## 2015-05-05 LAB — URINALYSIS
Bilirubin Urine: NEGATIVE
GLUCOSE, UA: NEGATIVE
HGB URINE DIPSTICK: NEGATIVE
KETONES UR: NEGATIVE
LEUKOCYTES UA: NEGATIVE
NITRITE: NEGATIVE
PROTEIN: NEGATIVE
Specific Gravity, Urine: 1.002 (ref 1.001–1.035)
pH: 7 (ref 5.0–8.0)

## 2015-05-05 LAB — HEPATIC FUNCTION PANEL
ALK PHOS: 30 U/L — AB (ref 33–115)
ALT: 42 U/L — ABNORMAL HIGH (ref 6–29)
AST: 25 U/L (ref 10–30)
Albumin: 4.1 g/dL (ref 3.6–5.1)
BILIRUBIN DIRECT: 0.1 mg/dL (ref <=0.2)
BILIRUBIN INDIRECT: 0.4 mg/dL (ref 0.2–1.2)
TOTAL PROTEIN: 6.7 g/dL (ref 6.1–8.1)
Total Bilirubin: 0.5 mg/dL (ref 0.2–1.2)

## 2015-05-05 LAB — SODIUM: SODIUM: 136 mmol/L (ref 135–146)

## 2015-05-05 LAB — TSH: TSH: 1.123 u[IU]/mL (ref 0.350–4.500)

## 2015-05-05 LAB — D-DIMER, QUANTITATIVE (NOT AT ARMC): D DIMER QUANT: 2.13 ug{FEU}/mL — AB (ref 0.00–0.48)

## 2015-05-05 LAB — FERRITIN: FERRITIN: 102 ng/mL (ref 10–291)

## 2015-05-05 MED ORDER — IOHEXOL 350 MG/ML SOLN
100.0000 mL | Freq: Once | INTRAVENOUS | Status: AC | PRN
  Administered 2015-05-05: 100 mL via INTRAVENOUS

## 2015-05-05 NOTE — Addendum Note (Signed)
Addended by: Nani Gasser D on: 05/05/2015 08:16 AM   Modules accepted: Orders

## 2015-07-26 ENCOUNTER — Encounter: Payer: Self-pay | Admitting: Family Medicine

## 2015-07-27 NOTE — Telephone Encounter (Signed)
Called pt to advise her to seek medical attention asap. Pt stated that she did go to an urgent care in [place] yesterday and had a CT of her head. Advised pt to follow up with pcp asap. She stated that she will give us a call back to schedule.

## 2015-08-09 ENCOUNTER — Encounter: Payer: Self-pay | Admitting: Family Medicine

## 2015-08-09 MED ORDER — CITALOPRAM HYDROBROMIDE 20 MG PO TABS: 20.0000 mg | ORAL_TABLET | Freq: Every day | ORAL | Status: DC

## 2015-08-11 ENCOUNTER — Telehealth: Payer: Self-pay | Admitting: *Deleted

## 2015-08-11 DIAGNOSIS — Z3041 Encounter for surveillance of contraceptive pills: Secondary | ICD-10-CM

## 2015-08-11 MED ORDER — NORGESTIMATE-ETH ESTRADIOL 0.25-35 MG-MCG PO TABS: 1.0000 | ORAL_TABLET | Freq: Every day | ORAL | Status: DC

## 2015-08-11 NOTE — Telephone Encounter (Signed)
Pt called stating that she no longer uses mail order for her OCP because of insurance change.  [facility] [address]

## 2015-08-15 ENCOUNTER — Encounter: Payer: Self-pay | Admitting: Obstetrics & Gynecology

## 2015-10-27 ENCOUNTER — Encounter: Payer: Self-pay | Admitting: Family Medicine

## 2015-11-29 ENCOUNTER — Encounter: Payer: Self-pay | Admitting: Family Medicine

## 2015-12-18 ENCOUNTER — Other Ambulatory Visit: Payer: Self-pay | Admitting: Family Medicine

## 2016-03-14 ENCOUNTER — Ambulatory Visit: Payer: BLUE CROSS/BLUE SHIELD | Admitting: Obstetrics & Gynecology

## 2016-03-25 ENCOUNTER — Encounter: Payer: Self-pay | Admitting: Obstetrics & Gynecology

## 2016-03-25 ENCOUNTER — Ambulatory Visit (INDEPENDENT_AMBULATORY_CARE_PROVIDER_SITE_OTHER): Payer: Managed Care, Other (non HMO) | Admitting: Obstetrics & Gynecology

## 2016-03-25 VITALS — BP 147/96 | HR 86 | Ht 62.0 in | Wt 142.0 lb

## 2016-03-25 DIAGNOSIS — Z113 Encounter for screening for infections with a predominantly sexual mode of transmission: Secondary | ICD-10-CM | POA: Diagnosis not present

## 2016-03-25 DIAGNOSIS — Z124 Encounter for screening for malignant neoplasm of cervix: Secondary | ICD-10-CM

## 2016-03-25 DIAGNOSIS — Z01419 Encounter for gynecological examination (general) (routine) without abnormal findings: Secondary | ICD-10-CM | POA: Diagnosis not present

## 2016-03-25 DIAGNOSIS — Z0000 Encounter for general adult medical examination without abnormal findings: Secondary | ICD-10-CM

## 2016-03-25 DIAGNOSIS — Z1151 Encounter for screening for human papillomavirus (HPV): Secondary | ICD-10-CM | POA: Diagnosis not present

## 2016-03-25 MED ORDER — MISOPROSTOL 200 MCG PO TABS: ORAL_TABLET | ORAL | 0 refills | Status: DC

## 2016-03-25 NOTE — Progress Notes (Signed)
Subjective:    Shirley Ruiz is a 30 y.o. separated (for 2 months)  W 20P1(32 yo daughter) female who presents for an annual exam. The patient has no complaints today. She would like to restart an IUD. She used the Mirena for a total of 9 years. The patient is sexually active. GYN screening history: last pap: was normal. The patient wears seatbelts: yes. The patient participates in regular exercise: yes. Has the patient ever been transfused or tattooed?: yes. The patient reports that there is not domestic violence in her life.   Menstrual History: OB History    Gravida Para Term Preterm AB Living   1 1 1     1    SAB TAB Ectopic Multiple Live Births                  Menarche age: 89 Patient's last menstrual period was 03/13/2016 (exact date).    The following portions of the patient's history were reviewed and updated as appropriate: allergies, current medications, past family history, past medical history, past social history, past surgical history and problem list.  Review of Systems Pertinent items are noted in HPI.  Works for [employer] [place]. No FH of breast/gyn/colon cancer.   Objective:    BP (!) 147/96   Pulse 86   Ht 5\' 2"  (1.575 m)   Wt 142 lb (64.4 kg)   LMP 03/13/2016 (Exact Date) Comment: Pt states that periods periods are very light and short since she has had IUD removed  BMI 25.97 kg/m   General Appearance:    Alert, cooperative, no distress, appears stated age  Head:    Normocephalic, without obvious abnormality, atraumatic  Eyes:    PERRL, conjunctiva/corneas clear, EOM's intact, fundi    benign, both eyes  Ears:    Normal TM's and external ear canals, both ears  Nose:   Nares normal, septum midline, mucosa normal, no drainage    or sinus tenderness  Throat:   Lips, mucosa, and tongue normal; teeth and gums normal  Neck:   Supple, symmetrical, trachea midline, no adenopathy;    thyroid:  no enlargement/tenderness/nodules; no carotid   bruit or JVD  Back:      Symmetric, no curvature, ROM normal, no CVA tenderness  Lungs:     Clear to auscultation bilaterally, respirations unlabored  Chest Wall:    No tenderness or deformity   Heart:    Regular rate and rhythm, S1 and S2 normal, no murmur, rub   or gallop  Breast Exam:    No tenderness, masses, or nipple abnormality  Abdomen:     Soft, non-tender, bowel sounds active all four quadrants,    no masses, no organomegaly  Genitalia:    Normal female without lesion, discharge or tenderness, ULN size, mobile, NT, no palpable adnexal masses     Extremities:   Extremities normal, atraumatic, no cyanosis or edema  Pulses:   2+ and symmetric all extremities  Skin:   Skin color, texture, turgor normal, no rashes or lesions  Lymph nodes:   Cervical, supraclavicular, and axillary nodes normal  Neurologic:   CNII-XII intact, normal strength, sensation and reflexes    throughout   .    Assessment:    Healthy female exam.    Plan:     Thin prep Pap smear. with cotesting Schedule Mirena insertion. Pretreat with cytotec (pregnancy precautions reviewed)- She is on OCPs and doesn't forget any.

## 2016-03-25 NOTE — Addendum Note (Signed)
Addended by: Granville LewisLARK, Leaner Morici L on: 03/25/2016 03:54 PM   Modules accepted: Orders

## 2016-03-27 LAB — CYTOLOGY - PAP

## 2016-04-02 ENCOUNTER — Ambulatory Visit: Payer: Managed Care, Other (non HMO) | Admitting: Obstetrics & Gynecology

## 2016-04-16 ENCOUNTER — Ambulatory Visit: Payer: Managed Care, Other (non HMO) | Admitting: Obstetrics & Gynecology

## 2016-04-23 ENCOUNTER — Ambulatory Visit (INDEPENDENT_AMBULATORY_CARE_PROVIDER_SITE_OTHER): Payer: Managed Care, Other (non HMO) | Admitting: Family Medicine

## 2016-04-23 ENCOUNTER — Encounter: Payer: Self-pay | Admitting: *Deleted

## 2016-04-23 ENCOUNTER — Encounter: Payer: Self-pay | Admitting: Family Medicine

## 2016-04-23 ENCOUNTER — Ambulatory Visit (INDEPENDENT_AMBULATORY_CARE_PROVIDER_SITE_OTHER): Payer: Managed Care, Other (non HMO) | Admitting: Obstetrics & Gynecology

## 2016-04-23 ENCOUNTER — Encounter: Payer: Self-pay | Admitting: Obstetrics & Gynecology

## 2016-04-23 VITALS — BP 148/81 | HR 91 | Ht 62.0 in | Wt 139.0 lb

## 2016-04-23 VITALS — BP 135/95 | HR 86 | Resp 16 | Ht 63.0 in | Wt 142.0 lb

## 2016-04-23 DIAGNOSIS — Z23 Encounter for immunization: Secondary | ICD-10-CM

## 2016-04-23 DIAGNOSIS — Z3202 Encounter for pregnancy test, result negative: Secondary | ICD-10-CM | POA: Diagnosis not present

## 2016-04-23 DIAGNOSIS — Z0000 Encounter for general adult medical examination without abnormal findings: Secondary | ICD-10-CM

## 2016-04-23 DIAGNOSIS — Z30430 Encounter for insertion of intrauterine contraceptive device: Secondary | ICD-10-CM

## 2016-04-23 LAB — POCT URINE PREGNANCY: Preg Test, Ur: NEGATIVE

## 2016-04-23 MED ORDER — CLONAZEPAM 0.5 MG PO TABS: 0.5000 mg | ORAL_TABLET | Freq: Every day | ORAL | 0 refills | Status: DC | PRN

## 2016-04-23 MED ORDER — LEVONORGESTREL 20 MCG/24HR IU IUD
INTRAUTERINE_SYSTEM | Freq: Once | INTRAUTERINE | Status: AC
  Administered 2016-04-23: 15:00:00 via INTRAUTERINE

## 2016-04-23 MED ORDER — FLUOXETINE HCL 20 MG PO TABS: 20.0000 mg | ORAL_TABLET | Freq: Every day | ORAL | 2 refills | Status: DC

## 2016-04-23 NOTE — Patient Instructions (Signed)
Keep up a regular exercise program and make sure you are eating a healthy diet Try to eat 4 servings of dairy a day, or if you are lactose intolerant take a calcium with vitamin D daily.  Your vaccines are up to date.   

## 2016-04-23 NOTE — Progress Notes (Signed)
   Subjective:    Patient ID: Shirley Ruiz, female    DOB: 12/31/1985, 30 y.o.   MRN: [id number]  HPI She is here for Mirena insertion. She has used them in the past with good results. She took cytotec last night.   Review of Systems     Objective:   Physical Exam [id number] Breathing, conversing, and ambulating normally UPT negative, consent signed, Time out procedure done. Cervix prepped with betadine and grasped with a single tooth tenaculum. Mirena was easily placed and the strings were cut to 3-4 cm. Uterus sounded to 9 cm. She tolerated the procedure well.      Assessment & Plan:  Contraception- Mirena- Rec back up for 1 month

## 2016-04-23 NOTE — Progress Notes (Signed)
Subjective:     Shirley Ruiz is a 30 y.o. female and is here for a comprehensive physical exam. The patient reports problems - she is going through a divorce. she is raising her daughter by herselft. she Would like to restart her Prozac. She plans on getting her daughter into some counseling as well. She herself is interested in counseling but is worried she would not be a make the copayments.  Social History   Social History  . Marital status: Divorced    Spouse name: N/A  . Number of children: 1  . Years of education: N/A   Occupational History  . Not on file.   Social History Main Topics  . Smoking status: Never Smoker  . Smokeless tobacco: Never Used  . Alcohol use Yes     Comment: occasionally as a social drinker  . Drug use: No  . Sexual activity: Yes    Partners: Male    Birth control/ protection: IUD   Other Topics Concern  . Not on file   Social History Narrative   Regular exercise.  Works at [employer]. She is back in school to get a college degree.  Raising her daughter by herself.     Health Maintenance  Topic Date Due  . PAP SMEAR  03/26/2019  . TETANUS/TDAP  08/15/2021  . INFLUENZA VACCINE  Addressed  . HIV Screening  Completed    The following portions of the patient's history were reviewed and updated as appropriate: allergies, current medications, past family history, past medical history, past social history, past surgical history and problem list.  Review of Systems A comprehensive review of systems was negative.   Objective:    BP (!) 148/81   Pulse 91   Ht 5\' 2"  (1.575 m)   Wt 139 lb (63 kg)   LMP 03/13/2016 (Exact Date) Comment: Pt states that periods periods are very light and short since she has had IUD removed  SpO2 100%   BMI 25.42 kg/m  General appearance: alert, cooperative and appears stated age Head: Normocephalic, without obvious abnormality, atraumatic Eyes: conj clear, EOMI, PEERLA Ears: normal TM's and external ear  canals both ears Nose: Nares normal. Septum midline. Mucosa normal. No drainage or sinus tenderness. Throat: lips, mucosa, and tongue normal; teeth and gums normal Neck: no adenopathy, no carotid bruit, no JVD, supple, symmetrical, trachea midline and thyroid not enlarged, symmetric, no tenderness/mass/nodules Back: symmetric, no curvature. ROM normal. No CVA tenderness. Lungs: clear to auscultation bilaterally Heart: regular rate and rhythm, S1, S2 normal, no murmur, click, rub or gallop Abdomen: soft, non-tender; bowel sounds normal; no masses,  no organomegaly Extremities: extremities normal, atraumatic, no cyanosis or edema Pulses: 2+ and symmetric Skin: Skin color, texture, turgor normal. No rashes or lesions Lymph nodes: Cervical adenopathy: nl and Supraclavicular adenopathy: nl Neurologic: Alert and oriented X 3, normal strength and tone. Normal symmetric reflexes. Normal coordination and gait    Assessment:    Healthy female exam.      Plan:     See After Visit Summary for Counseling Recommendations   Keep up a regular exercise program and make sure you are eating a healthy diet Try to eat 4 servings of dairy a day, or if you are lactose intolerant take a calcium with vitamin D daily.  Your vaccines are up to date.    Acute situational depression-we'll restart her fluoxetine. Encouraged her checked through work to see if they offer employee assistance program with a therapist or  counselor. It may be a limited number of visits but would be really helpful for her. Follow-up in one month on the medication.

## 2016-05-23 ENCOUNTER — Ambulatory Visit (INDEPENDENT_AMBULATORY_CARE_PROVIDER_SITE_OTHER): Payer: Managed Care, Other (non HMO) | Admitting: Family Medicine

## 2016-05-23 ENCOUNTER — Encounter: Payer: Self-pay | Admitting: Family Medicine

## 2016-05-23 VITALS — BP 128/90 | HR 88 | Ht 62.0 in | Wt 130.0 lb

## 2016-05-23 DIAGNOSIS — F4323 Adjustment disorder with mixed anxiety and depressed mood: Secondary | ICD-10-CM

## 2016-05-23 DIAGNOSIS — Z202 Contact with and (suspected) exposure to infections with a predominantly sexual mode of transmission: Secondary | ICD-10-CM | POA: Diagnosis not present

## 2016-05-23 NOTE — Progress Notes (Signed)
Pt reports that she is doing well on medication.   She would like to get STD testing done today. She states that she has met someone and would like to get tested.Shirley Ruiz, Shirley Ruiz

## 2016-05-23 NOTE — Addendum Note (Signed)
Addended by: Deno EtienneBARKLEY, Zymiere Trostle L on: 05/23/2016 12:51 PM   Modules accepted: Orders

## 2016-05-23 NOTE — Progress Notes (Signed)
Subjective:    CC:   HPI:  Per week follow-up for depression/anxiety. She came in for wellness exam about a month ago and discussed that she was struggling with her depression again. She wanted to restart her fluoxetine. I had encouraged her to check into getting some therapy and help through her employee assistance program or to let me know if she was interested in seeing a therapist or counselor. She is currently going through a divorce and raising her daughter alone. She still struggling with sleep but says over the last week it has actually gotten a little bit better. She says she is now falling asleep before 2 AM which is a significant improvement.  She would like STD testing. With her recent divorce she has not had any STD testing since before they got married. She would like to have that done today. No signs or symptoms.   Past medical history, Surgical history, Family history not pertinant except as noted below, Social history, Allergies, and medications have been entered into the medical record, reviewed, and corrections made.   Review of Systems: No fevers, chills, night sweats, weight loss, chest pain, or shortness of breath.   Objective:    General: Well Developed, well nourished, and in no acute distress.  Neuro: Alert and oriented x3, extra-ocular muscles intact, sensation grossly intact.  HEENT: Normocephalic, atraumatic  Skin: Warm and dry, no rashes. Cardiac: Regular rate and rhythm, no murmurs rubs or gallops, no lower extremity edema.  Respiratory: Clear to auscultation bilaterally. Not using accessory muscles, speaking in full sentences.   Impression and Recommendations:   Acute depression/anxiety-PHQ 9 score of 7, GAD 7 score of 11. She rates her symptoms as somewhat difficult for depression and very difficult for anxiety. We discussed options including increasing medication or continue with current regimen and giving it another month to reach full efficacy. She prefers  to stay at 20 mg for now. We'll continue to monitor for sleep improvement.  Insomnia - secondary to mood.  Improving over the last week.   She would also like to have STD testing done. Labs ordered.

## 2016-05-24 LAB — COMPLETE METABOLIC PANEL WITH GFR
ALT: 26 U/L (ref 6–29)
AST: 21 U/L (ref 10–30)
Albumin: 4.5 g/dL (ref 3.6–5.1)
Alkaline Phosphatase: 43 U/L (ref 33–115)
BILIRUBIN TOTAL: 0.7 mg/dL (ref 0.2–1.2)
BUN: 10 mg/dL (ref 7–25)
CALCIUM: 9.2 mg/dL (ref 8.6–10.2)
CO2: 24 mmol/L (ref 20–31)
CREATININE: 0.74 mg/dL (ref 0.50–1.10)
Chloride: 100 mmol/L (ref 98–110)
GFR, Est Non African American: >89 mL/min (ref >=60)
Glucose, Bld: 77 mg/dL (ref 65–99)
Potassium: 4.3 mmol/L (ref 3.5–5.3)
Sodium: 135 mmol/L (ref 135–146)
TOTAL PROTEIN: 7.2 g/dL (ref 6.1–8.1)

## 2016-05-24 LAB — LIPID PANEL
CHOLESTEROL: 131 mg/dL (ref 125–200)
HDL: 60 mg/dL (ref >=46)
LDL CALC: 61 mg/dL (ref <130)
TRIGLYCERIDES: 48 mg/dL (ref <150)
Total CHOL/HDL Ratio: 2.2 Ratio (ref <=5.0)
VLDL: 10 mg/dL (ref <30)

## 2016-05-24 LAB — TSH: TSH: 0.57 mIU/L

## 2016-05-24 LAB — HIV ANTIBODY (ROUTINE TESTING W REFLEX): HIV 1&2 Ab, 4th Generation: NONREACTIVE

## 2016-05-24 LAB — GC/CHLAMYDIA PROBE AMP
CT PROBE, AMP APTIMA: NOT DETECTED
GC PROBE AMP APTIMA: NOT DETECTED

## 2016-05-24 LAB — RPR

## 2016-07-04 ENCOUNTER — Ambulatory Visit: Payer: Managed Care, Other (non HMO) | Admitting: Family Medicine

## 2016-07-18 IMAGING — CT CT ANGIO CHEST
2 of 6 series · 19 of 36 positions shown · IV contrast (APPLIED)
Comparison: Chest radiograph May 04, 2015

CLINICAL DATA: Shortness of Breath

EXAM:
CT ANGIOGRAPHY CHEST WITH CONTRAST
TECHNIQUE: Multidetector CT imaging of the chest was performed using the
standard protocol during bolus administration of intravenous
contrast. Multiplanar CT image reconstructions and MIPs were
obtained to evaluate the vascular anatomy.
CONTRAST:  100mL OMNIPAQUE IOHEXOL 350 MG/ML SOLN

[Series 5: pe 1.0 b26f · axial · 0.61mm/px · z∈[-276,-22]mm · 18 of 284 slices shown]
[im 15/284  lung]
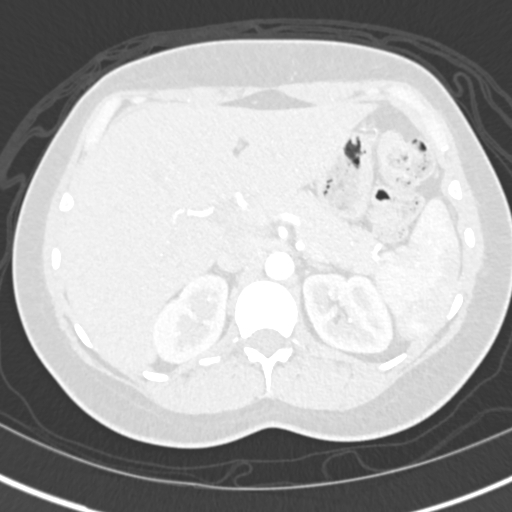
[im 29/284  mediastinal]
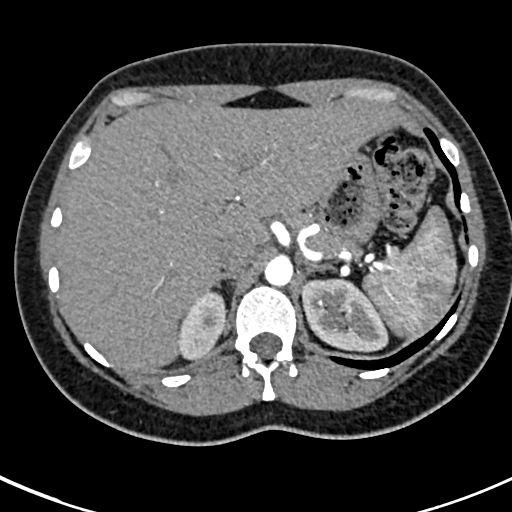
[im 43/284  lung]
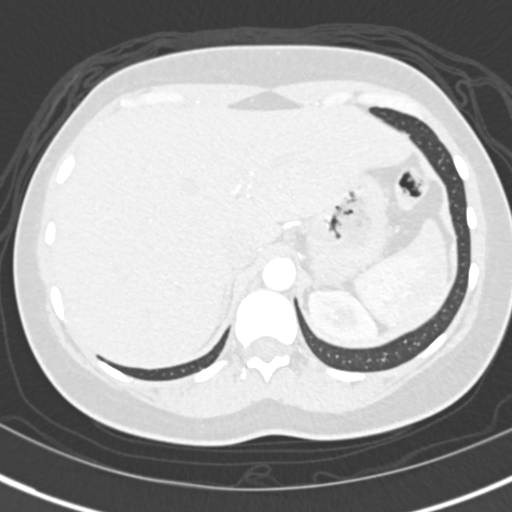
[im 57/284  mediastinal]
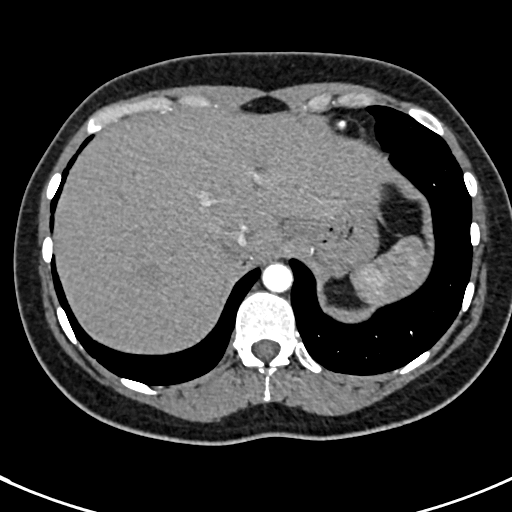
[im 71/284  lung]
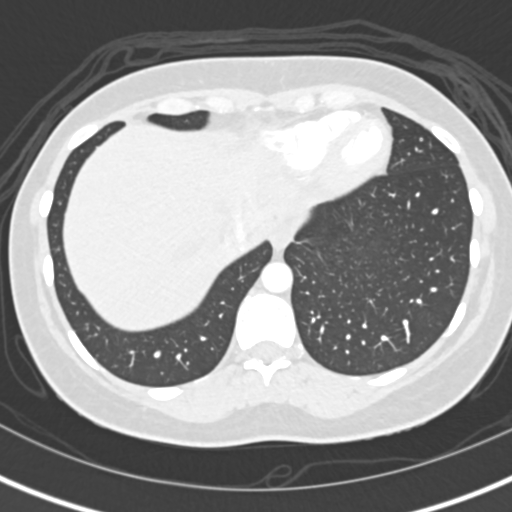
[im 85/284  mediastinal]
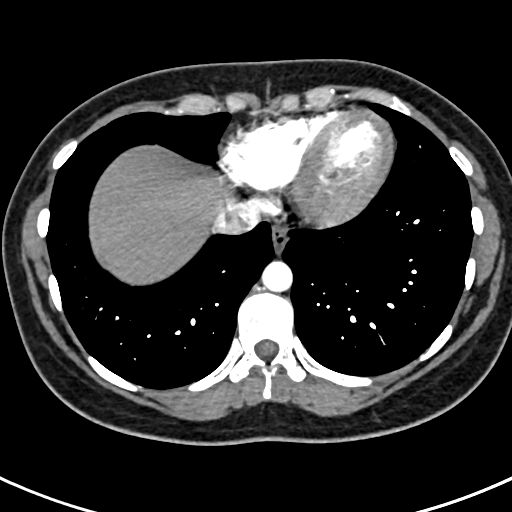
[im 100/284  lung]
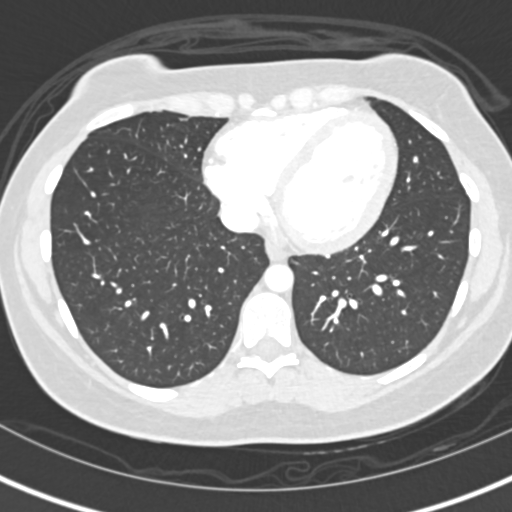
[im 114/284  mediastinal]
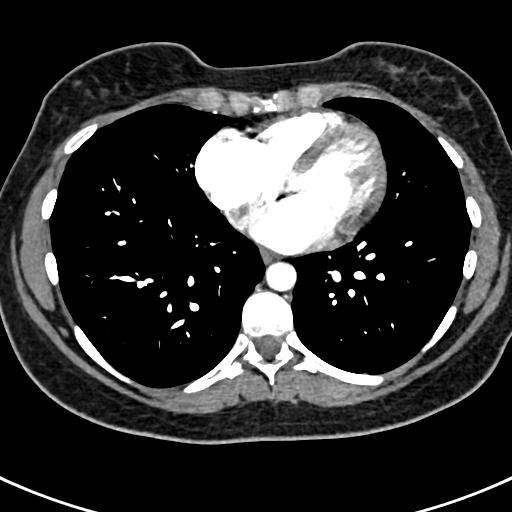
[im 128/284  lung]
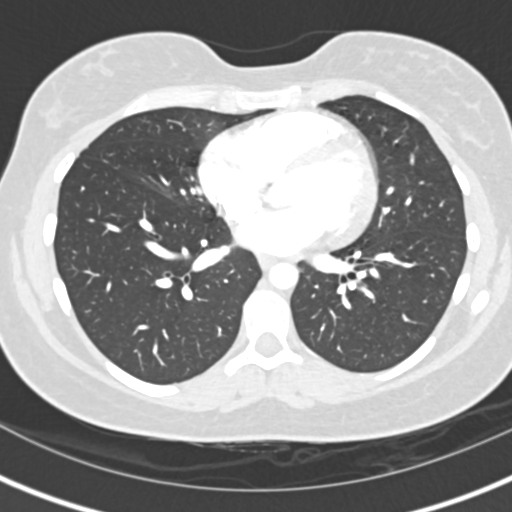
[im 156/284  mediastinal]
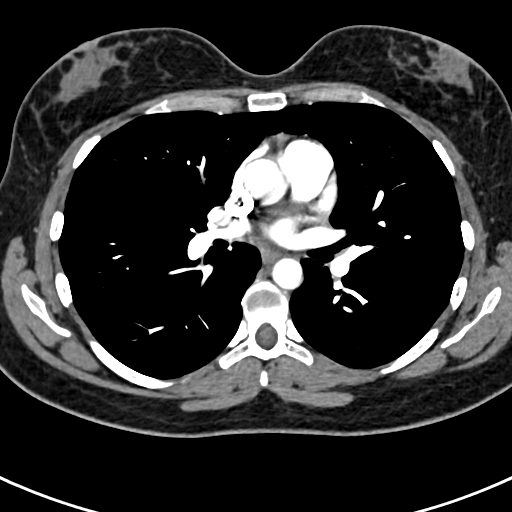
[im 170/284  lung]
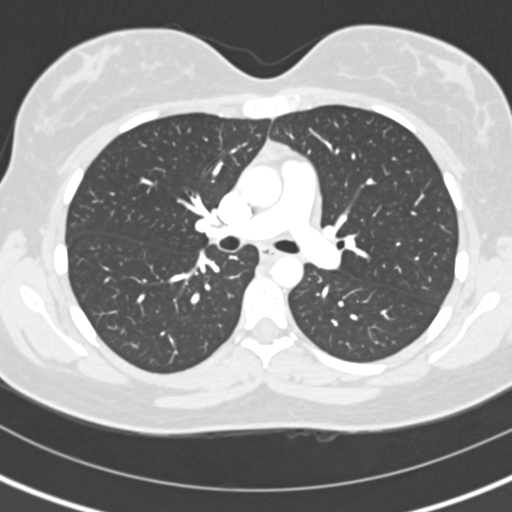
[im 184/284  mediastinal]
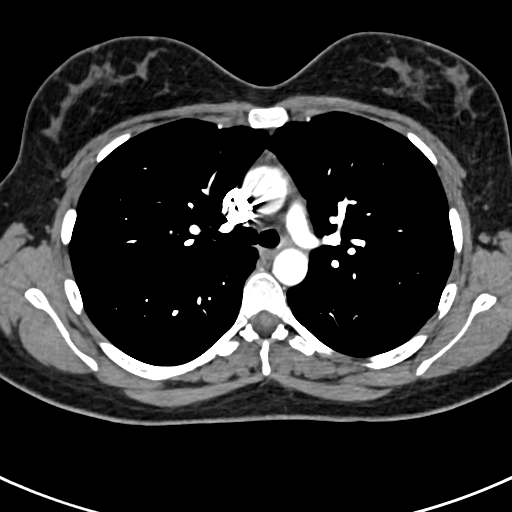
[im 199/284  lung]
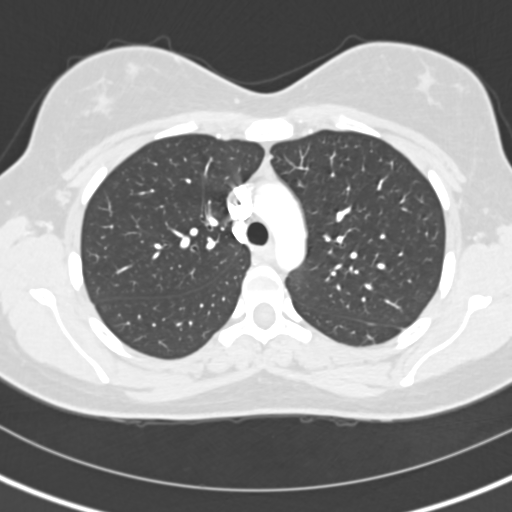
[im 213/284  mediastinal]
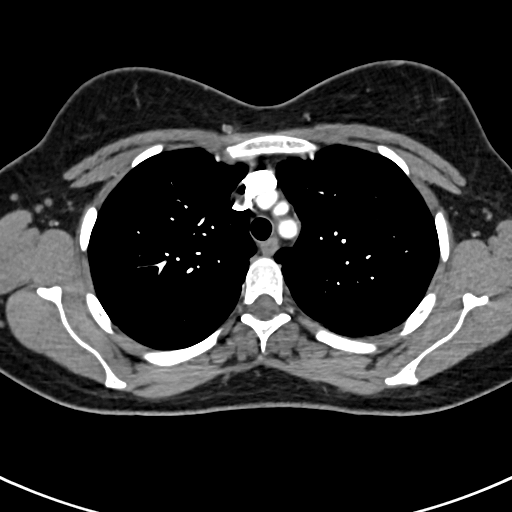
[im 227/284  lung]
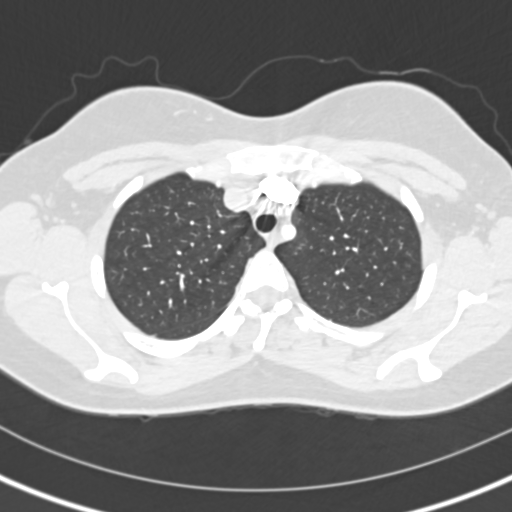
[im 241/284  mediastinal]
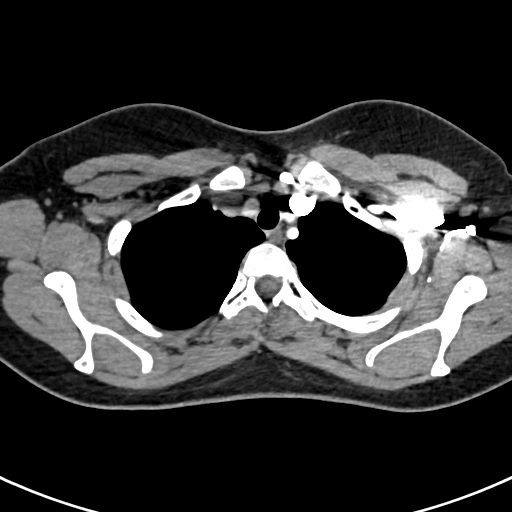
[im 255/284  lung]
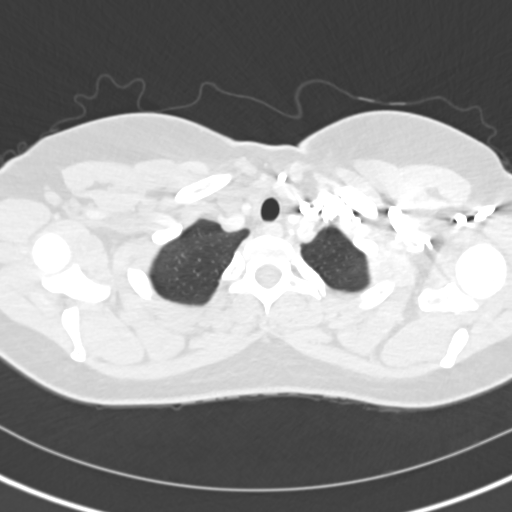
[im 269/284  mediastinal]
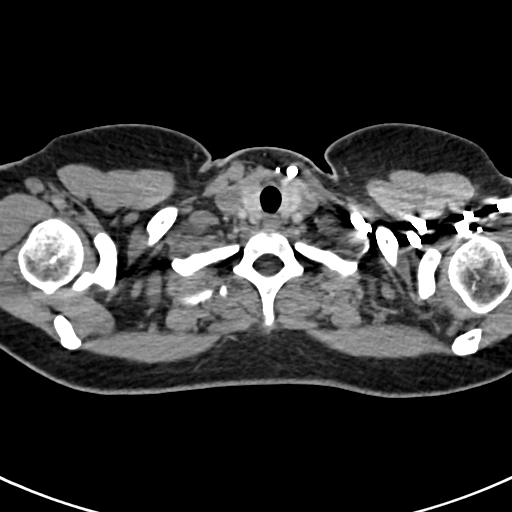

[Series 8: pe 2.0 coronal · coronal · 0.56mm/px · 1 of 110 slices shown]
[im 55/110  mediastinal]
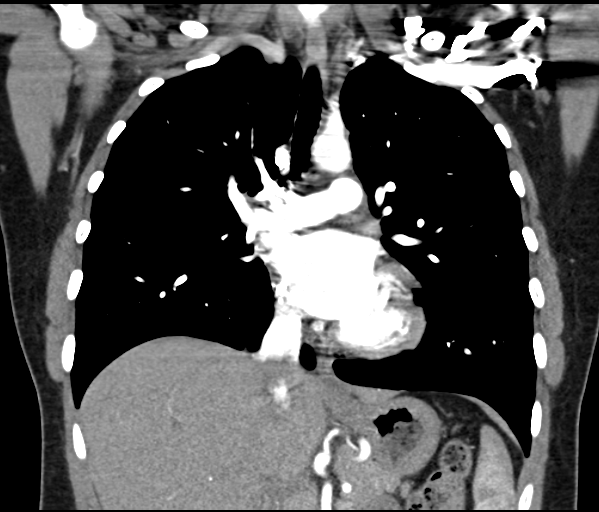

[19 of 36 positions shown; findings below may reference images not displayed]

FINDINGS: There is no demonstrable pulmonary embolus. There is no thoracic
aortic aneurysm or dissection. The visualized great vessels appear
normal.

The lungs are clear. Thyroid appears normal. There is no thoracic
adenopathy. The pericardium is not thickened.

Visualized upper abdominal structures appear unremarkable.

There are no blastic or lytic bone lesions.

Review of the MIP images confirms the above findings.
IMPRESSION: No demonstrable pulmonary embolus. Lungs clear. No thoracic
adenopathy.

## 2016-09-16 ENCOUNTER — Other Ambulatory Visit: Payer: Self-pay | Admitting: Family Medicine

## 2016-10-16 ENCOUNTER — Telehealth: Payer: Self-pay | Admitting: Family Medicine

## 2016-10-16 NOTE — Telephone Encounter (Signed)
I left pt a message stating to call our office and schedule a f/u appt with Dr. Linford Arnoldmetheney

## 2016-11-13 ENCOUNTER — Ambulatory Visit (INDEPENDENT_AMBULATORY_CARE_PROVIDER_SITE_OTHER): Payer: Managed Care, Other (non HMO) | Admitting: Family Medicine

## 2016-11-13 VITALS — BP 132/86 | HR 73 | Wt 121.0 lb

## 2016-11-13 DIAGNOSIS — F5101 Primary insomnia: Secondary | ICD-10-CM

## 2016-11-13 DIAGNOSIS — F4323 Adjustment disorder with mixed anxiety and depressed mood: Secondary | ICD-10-CM | POA: Diagnosis not present

## 2016-11-13 MED ORDER — CLONAZEPAM 0.5 MG PO TABS: 0.5000 mg | ORAL_TABLET | Freq: Every day | ORAL | 0 refills | Status: DC | PRN

## 2016-11-13 MED ORDER — FLUOXETINE HCL 20 MG PO CAPS: 20.0000 mg | ORAL_CAPSULE | Freq: Every day | ORAL | 1 refills | Status: DC

## 2016-11-13 NOTE — Patient Instructions (Signed)
Try the Kristopher Glee on You Tube fore sleep.

## 2016-11-13 NOTE — Progress Notes (Signed)
   Subjective:    Patient ID: Shirley Ruiz, female    DOB: 04/10/1986, 31 y.o.   MRN: [id number]  HPI  6 month f/u for mood.  Just adjustment disorder with mixed anxiety and depression. She is currently on fluoxetine 20 mg. She does need refills today. She would also like to have something to take on her flight for anxiety during the flight itself. She's actually traveling to the [place] for a wedding.  She reports that she still struggling with sleep. Most nights only gets about 4-5 hours. She mostly has difficulty falling asleep not so much staying asleep. She has tried [place] in the past and did well but was a $50 co-pay so could no longer afford it. She actually takes melatonin every night and has been doing some biometric feedback videos on YouTube to try to help her sleep. She does drink one cup coffee in the morning and tries to go to bed around the same time.  Review of Systems     Objective:   Physical Exam  Constitutional: She is oriented to person, place, and time. She appears well-developed and well-nourished.  HENT:  Head: Normocephalic and atraumatic.  Cardiovascular: Normal rate, regular rhythm and normal heart sounds.   Pulmonary/Chest: Effort normal and breath sounds normal.  Neurological: She is alert and oriented to person, place, and time.  Skin: Skin is warm and dry.  Psychiatric: She has a normal mood and affect. Her behavior is normal.          Assessment & Plan:  Adjustment disorder with anxiety and depression-doing well on her current regimen. GAD 7 score of 10 and PHQ 9 score of 7 but she feels happy with her regimen and doesn't really want to make any significant adjustments today. Will refill for 6 months. Also refilled her clonazepam since she can use that during her flight. Next  Insomnia-recommend a trial of Kristopher Glee on YouTube to help with sleep. If no success after couple weeks then we could always restart the Lunesta. Now that it's  generic the co-pay would likely be much more affordable. Did warn about potential dependency with his types of medications though.

## 2017-05-15 ENCOUNTER — Ambulatory Visit: Payer: Managed Care, Other (non HMO) | Admitting: Family Medicine

## 2017-05-29 ENCOUNTER — Ambulatory Visit (INDEPENDENT_AMBULATORY_CARE_PROVIDER_SITE_OTHER): Payer: Managed Care, Other (non HMO) | Admitting: Family Medicine

## 2017-05-29 ENCOUNTER — Encounter: Payer: Self-pay | Admitting: Family Medicine

## 2017-05-29 VITALS — BP 136/75 | HR 84 | Ht 62.0 in | Wt 131.0 lb

## 2017-05-29 DIAGNOSIS — F4323 Adjustment disorder with mixed anxiety and depressed mood: Secondary | ICD-10-CM | POA: Diagnosis not present

## 2017-05-29 DIAGNOSIS — F5101 Primary insomnia: Secondary | ICD-10-CM | POA: Diagnosis not present

## 2017-05-29 DIAGNOSIS — Z23 Encounter for immunization: Secondary | ICD-10-CM

## 2017-05-29 MED ORDER — FLUOXETINE HCL 40 MG PO CAPS: 40.0000 mg | ORAL_CAPSULE | Freq: Every day | ORAL | 1 refills | Status: DC

## 2017-05-29 NOTE — Progress Notes (Signed)
   Subjective:    Patient ID: Shirley Ruiz, female    DOB: 1986-07-13, 31 y.o.   MRN: [id number]  HPI 3678-month follow-up for adjustment disorder with mixed anxiety and depression.  She is currently on fluoxetine 20 mg.  She reports healing down and depressed several days a week and feeling like she has low energy almost every day.  She also complains of feeling nervous and anxious several days of the week and some irritability.  No thoughts of wanting to harm herself.    Her divorce is final now.  She says she is struggling a little bit emotionally.  Kind of like opening an old wound but she knows she will get through it.  F/U insomnia -overall her sleep is better than it was when I saw her 6 months ago.  Still struggling some to fall asleep and she has been taking beet supplement over-the-counter to help her and it does seem to be helping some.   Review of Systems     Objective:   Physical Exam  Constitutional: She is oriented to person, place, and time. She appears well-developed and well-nourished.  HENT:  Head: Normocephalic and atraumatic.  Cardiovascular: Normal rate, regular rhythm and normal heart sounds.   Pulmonary/Chest: Effort normal and breath sounds normal.  Neurological: She is alert and oriented to person, place, and time.  Skin: Skin is warm and dry.  Psychiatric: She has a normal mood and affect. Her behavior is normal.        Assessment & Plan:  Adjustment disorder with anxiety depression-PHQ 9 score of 11, and GAD-7 score of 4.  She is interested in increasing her fluoxetine so we will bump her to 40 mg daily.  She is really already been taking a total of 40 mg for the last couple of weeks.  She plans on getting back into exercise which I think will help as well.  Plan to follow back up in 6 months again.  Can follow-up sooner if she is having any problems or concerns.  Insomnia -not completely resolved but improved.  We will continue to monitor.

## 2017-11-27 ENCOUNTER — Ambulatory Visit: Payer: Managed Care, Other (non HMO) | Admitting: Family Medicine

## 2017-12-02 ENCOUNTER — Ambulatory Visit: Payer: Managed Care, Other (non HMO) | Admitting: Family Medicine

## 2017-12-02 ENCOUNTER — Encounter: Payer: Self-pay | Admitting: Family Medicine

## 2017-12-02 VITALS — BP 130/66 | HR 85 | Ht 62.0 in | Wt 128.0 lb

## 2017-12-02 DIAGNOSIS — G43009 Migraine without aura, not intractable, without status migrainosus: Secondary | ICD-10-CM

## 2017-12-02 DIAGNOSIS — Z113 Encounter for screening for infections with a predominantly sexual mode of transmission: Secondary | ICD-10-CM | POA: Diagnosis not present

## 2017-12-02 DIAGNOSIS — F4323 Adjustment disorder with mixed anxiety and depressed mood: Secondary | ICD-10-CM | POA: Diagnosis not present

## 2017-12-02 MED ORDER — SUMATRIPTAN SUCCINATE 100 MG PO TABS
ORAL_TABLET | ORAL | 5 refills | Status: DC
Start: 2017-12-02 — End: 2019-10-11

## 2017-12-02 MED ORDER — FLUOXETINE HCL 60 MG PO TABS: 1.0000 | ORAL_TABLET | Freq: Every day | ORAL | 1 refills | Status: DC

## 2017-12-02 NOTE — Progress Notes (Signed)
Subjective:    Patient ID: Shirley Ruiz, female    DOB: 23-Jan-1986, 32 y.o.   MRN: [id number]  HPI F/u Migriane HA -not currently on prophylaxis just on rescue medication as needed.  She is here today for six-month follow-up.  He is actually doing really well.  She is been exercising daily and really has not had very many headaches or migraines.  Follow up adjustment disorder with mixed depression and anxiety-currently on fluoxetine 40 mg daily.  Her divorce was final when I saw her 6 months ago but she was still adjusting. She has been having some crying spells and feeling more emotional.  She is not in therapy.     She was positive for chlamydia.  In December.  Her husband had been cheating on her.  She is due for repeat test for cure.   Review of Systems  BP 130/66   Pulse 85   Ht  (1.575 m)   Wt 128 lb (58.1 kg)   SpO2 100%   BMI 23.41 kg/m     Allergies  Allergen Reactions  . Penicillins Hives  . Wellbutrin [Bupropion] Other (See Comments)    Increased PVCs    Past Medical History:  Diagnosis Date  . Abnormal Pap smear 6/10   .HGSIL, Colpo and Cryo  . Anxiety   . Asthma   . Depression   . Diabetes in pregnancy    Gestational  . Intervertebral disc protrusion   . Mental disorder    H/O depression  . Migraines   . PVC (premature ventricular contraction)     Past Surgical History:  Procedure Laterality Date  . MOUTH SURGERY     Cut in mouth to remove an extra tooth  . NO PAST SURGERIES      Social History   Socioeconomic History  . Marital status: Divorced    Spouse name: Not on file  . Number of children: 1  . Years of education: Not on file  . Highest education level: Not on file  Occupational History  . Not on file  Social Needs  . Financial resource strain: Not on file  . Food insecurity:    Worry: Not on file    Inability: Not on file  . Transportation needs:    Medical: Not on file    Non-medical: Not on file  Tobacco Use  .  Smoking status: Never Smoker  . Smokeless tobacco: Never Used  Substance and Sexual Activity  . Alcohol use: Yes    Comment: occasionally as a social drinker  . Drug use: No  . Sexual activity: Yes    Partners: Male    Birth control/protection: IUD  Lifestyle  . Physical activity:    Days per week: Not on file    Minutes per session: Not on file  . Stress: Not on file  Relationships  . Social connections:    Talks on phone: Not on file    Gets together: Not on file    Attends religious service: Not on file    Active member of club or organization: Not on file    Attends meetings of clubs or organizations: Not on file    Relationship status: Not on file  . Intimate partner violence:    Fear of current or ex partner: Not on file    Emotionally abused: Not on file    Physically abused: Not on file    Forced sexual activity: Not on file  Other Topics Concern  .  Not on file  Social History Narrative   Regular exercise.  Works at [employer]. She is back in school to get a college degree.  Raising her daughter by herself.      Family History  Problem Relation Age of Onset  . Depression Mother   . Hypertension Mother   . Heart disease Mother   . Depression Maternal Grandmother   . Heart attack Father   . Heart disease Maternal Grandfather     Outpatient Encounter Medications as of 12/02/2017  Medication Sig  . Esomeprazole Magnesium (NEXIUM PO) Take by mouth.  . ondansetron (ZOFRAN) 8 MG tablet Take 1 tablet (8 mg total) by mouth every 8 (eight) hours as needed. for nausea  . phentermine 37.5 MG capsule Take 1 capsule by mouth every morning.  . SUMAtriptan (IMITREX) 100 MG tablet TAKE ONE TABLET BY MOUTH EVERY 2 HOURS AS NEEDED FOR MIGRAINE  . [DISCONTINUED] FLUoxetine (PROZAC) 40 MG capsule Take 1 capsule (40 mg total) by mouth daily.  . [DISCONTINUED] SUMAtriptan (IMITREX) 100 MG tablet TAKE ONE TABLET BY MOUTH EVERY 2 HOURS AS NEEDED FOR MIGRAINE  . FLUoxetine HCl 60  MG TABS Take 1 tablet by mouth daily.   No facility-administered encounter medications on file as of 12/02/2017.          Objective:   Physical Exam  Constitutional: She is oriented to person, place, and time. She appears well-developed and well-nourished.  HENT:  Head: Normocephalic and atraumatic.  Cardiovascular: Normal rate, regular rhythm and normal heart sounds.  Pulmonary/Chest: Effort normal and breath sounds normal.  Neurological: She is alert and oriented to person, place, and time.  Skin: Skin is warm and dry.  Psychiatric: She has a normal mood and affect. Her behavior is normal.        Assessment & Plan:  Migraine headaches-well-controlled overall.  Just using her Imitrex as needed.  Adjustment disorder-overall mood is not as well-controlled as I would like.  PHQ 9 score of 11 and gad score of 8.  She denies any thoughts of wanting to harm herself.  We discussed options.  She is open to seeing a therapist and counselor.  I be happy to place referral if she would like she will let me know.  We also discussed adjusting her medication.  Will increase fluoxetine to 60 mg.  She is will give me a call or send a MyChart note in about a month and let me know how she is doing.  Otherwise I will see her back in about 4 months.  Screening STD-retesting for gonorrhea and chlamydia.

## 2017-12-03 LAB — C. TRACHOMATIS/N. GONORRHOEAE RNA
C. trachomatis RNA, TMA: NOT DETECTED
N. GONORRHOEAE RNA, TMA: NOT DETECTED

## 2018-02-12 ENCOUNTER — Telehealth: Payer: Self-pay

## 2018-02-12 MED ORDER — FLUOXETINE HCL 20 MG PO CAPS: 20.0000 mg | ORAL_CAPSULE | Freq: Every day | ORAL | 0 refills | Status: DC

## 2018-02-12 MED ORDER — FLUOXETINE HCL 40 MG PO CAPS: 40.0000 mg | ORAL_CAPSULE | Freq: Every day | ORAL | 0 refills | Status: DC

## 2018-02-12 NOTE — Telephone Encounter (Signed)
Request for controlled substance declined at this time. On my review of PCPs last note, 12/02/17, klonopin was not on the patient's list at that time, and no prescription had been written for it for about a year. Would defer to PCP. No controlled Rx without office visit w/ PCP or covering provider.    Also, did patient specify how longshe'd been out of medicine? If the prescription for the Prozac 60 mg was written over a month ago, there is no reason the patient should be out of medication for this long without letting us know until now.   Last note: Adjustment disorder-overall mood is not as well-controlled as I would like.  PHQ 9 score of 11 and gad score of 8.  She denies any thoughts of wanting to harm herself.  We discussed options.  She is open to seeing a therapist and counselor.  I be happy to place referral if she would like she will let me know.  We also discussed adjusting her medication.  Will increase fluoxetine to 60 mg.  She is will give me a call or send a MyChart note in about a month and let me know how she is doing.  Otherwise I will see her back in about 4 months.

## 2018-02-12 NOTE — Telephone Encounter (Signed)
Sent to [facility] on file  

## 2018-02-12 NOTE — Telephone Encounter (Signed)
Pt advised.   Pt questioned if a small quantity of Klonopin could be sent in since she has been out of the Prozac. Will route.

## 2018-02-12 NOTE — Telephone Encounter (Signed)
Left VM with update and recommendation. Callback provided for questions/scheduling.

## 2018-02-12 NOTE — Telephone Encounter (Signed)
Shirley Sheldonshley can't afford the Prozac 60 mg. Could she get the 40 mg and the 20 mg instead? The insurance will pay for the 40 mg and [facility] has the 20 mg for 3 dollars a month.

## 2018-04-07 ENCOUNTER — Ambulatory Visit (INDEPENDENT_AMBULATORY_CARE_PROVIDER_SITE_OTHER): Payer: Managed Care, Other (non HMO) | Admitting: Family Medicine

## 2018-04-07 ENCOUNTER — Encounter: Payer: Self-pay | Admitting: Family Medicine

## 2018-04-07 VITALS — BP 117/52 | HR 70 | Ht 62.0 in | Wt 140.0 lb

## 2018-04-07 DIAGNOSIS — R140 Abdominal distension (gaseous): Secondary | ICD-10-CM | POA: Diagnosis not present

## 2018-04-07 DIAGNOSIS — R142 Eructation: Secondary | ICD-10-CM

## 2018-04-07 DIAGNOSIS — Z23 Encounter for immunization: Secondary | ICD-10-CM

## 2018-04-07 DIAGNOSIS — R6889 Other general symptoms and signs: Secondary | ICD-10-CM

## 2018-04-07 DIAGNOSIS — R110 Nausea: Secondary | ICD-10-CM

## 2018-04-07 DIAGNOSIS — F4323 Adjustment disorder with mixed anxiety and depressed mood: Secondary | ICD-10-CM | POA: Diagnosis not present

## 2018-04-07 MED ORDER — VENLAFAXINE HCL ER 37.5 MG PO CP24: 37.5000 mg | ORAL_CAPSULE | Freq: Every day | ORAL | 0 refills | Status: DC

## 2018-04-07 NOTE — Progress Notes (Signed)
Subjective:    Patient ID: Shirley Ruiz, female    DOB: 06-Jan-1986, 32 y.o.   MRN: [id number]  HPI 32 year old female is here today to follow-up for her adjustment disorder with symptoms of anxiety and depression.  She feels like the 60mg  of prozac is not working well but has had extra stressors as well. Has been out of medication for about a week and a half.    She has had thoughts of being better off dead but has no active suicidal thoughts.  She has a stepbrother who has significant mental illness and his children are sort of caught in the middle.  Is created a lot of stress for her family.  Her father actually had another heart attack about a month or so ago.  She also wants to discuss that she feels hot all the time.  She says even when she gets up first thing in the morning and is headed to the gym she will just feel like she is very hot and sweaty.  She says this is been going on for years and is constant.  She is not sure what may cause it.  Also reports that for quite some time she is been dealing with feeling nauseated and expensing a lot of bloating after she eats.  No vomiting.  She will belch a lot and be very gassy afterwards.  She said she is tried 3 different over-the-counter PPIs including Prilosec, Prevacid and Nexium.  She tried each 1 for a 2-week trial and did not notice any significant improvement in her symptoms.  She does get some abdominal discomfort after eating as well but it is not localized.  No blood in the urine or stool.  No fever chills or sweats.   Review of Systems BP (!) 117/52   Pulse 70   Ht 5\' 2"  (1.575 m)   Wt 140 lb (63.5 kg)   SpO2 100%   BMI 25.61 kg/m     Allergies  Allergen Reactions  . Bupropion Other (See Comments) and Rash    Increased PVCs Increased PVCs Increased PVCs Increased PVCs  . Penicillins Hives    Past Medical History:  Diagnosis Date  . Abnormal Pap smear 6/10   .HGSIL, Colpo and Cryo  . Anxiety   . Asthma   .  Depression   . Diabetes in pregnancy    Gestational  . Intervertebral disc protrusion   . Mental disorder    H/O depression  . Migraines   . PVC (premature ventricular contraction)     Past Surgical History:  Procedure Laterality Date  . MOUTH SURGERY     Cut in mouth to remove an extra tooth  . NO PAST SURGERIES      Social History   Socioeconomic History  . Marital status: Divorced    Spouse name: Not on file  . Number of children: 1  . Years of education: Not on file  . Highest education level: Not on file  Occupational History  . Not on file  Social Needs  . Financial resource strain: Not on file  . Food insecurity:    Worry: Not on file    Inability: Not on file  . Transportation needs:    Medical: Not on file    Non-medical: Not on file  Tobacco Use  . Smoking status: Never Smoker  . Smokeless tobacco: Never Used  Substance and Sexual Activity  . Alcohol use: Yes    Comment: occasionally as a social  drinker  . Drug use: No  . Sexual activity: Yes    Partners: Male    Birth control/protection: IUD  Lifestyle  . Physical activity:    Days per week: Not on file    Minutes per session: Not on file  . Stress: Not on file  Relationships  . Social connections:    Talks on phone: Not on file    Gets together: Not on file    Attends religious service: Not on file    Active member of club or organization: Not on file    Attends meetings of clubs or organizations: Not on file    Relationship status: Not on file  . Intimate partner violence:    Fear of current or ex partner: Not on file    Emotionally abused: Not on file    Physically abused: Not on file    Forced sexual activity: Not on file  Other Topics Concern  . Not on file  Social History Narrative   Regular exercise.  Works at [employer]. She is back in school to get a college degree.  Raising her daughter by herself.      Family History  Problem Relation Age of Onset  . Depression Mother    . Hypertension Mother   . Heart disease Mother   . Depression Maternal Grandmother   . Heart attack Father   . Heart disease Maternal Grandfather     Outpatient Encounter Medications as of 04/07/2018  Medication Sig  . Esomeprazole Magnesium (NEXIUM PO) Take by mouth.  Marland Kitchen FLUoxetine (PROZAC) 20 MG capsule Take 1 capsule (20 mg total) by mouth daily. Take with 40 mg capsule for total 60 mg daily dose  . FLUoxetine (PROZAC) 40 MG capsule Take 1 capsule (40 mg total) by mouth daily. Take with 00 mg capsule for total 60 mg daily dose  . ondansetron (ZOFRAN) 8 MG tablet Take 1 tablet (8 mg total) by mouth every 8 (eight) hours as needed. for nausea  . SUMAtriptan (IMITREX) 100 MG tablet TAKE ONE TABLET BY MOUTH EVERY 2 HOURS AS NEEDED FOR MIGRAINE  . venlafaxine XR (EFFEXOR XR) 37.5 MG 24 hr capsule Take 1 capsule (37.5 mg total) by mouth daily with breakfast. After one week increase to 2 tabs daily  . [DISCONTINUED] phentermine 37.5 MG capsule Take 1 capsule by mouth every morning.   No facility-administered encounter medications on file as of 04/07/2018.          Objective:   Physical Exam  Constitutional: She is oriented to person, place, and time. She appears well-developed and well-nourished.  HENT:  Head: Normocephalic and atraumatic.  Cardiovascular: Normal rate, regular rhythm and normal heart sounds.  Pulmonary/Chest: Effort normal and breath sounds normal.  Abdominal: Soft. Bowel sounds are normal. She exhibits no distension and no mass. There is no tenderness. There is no rebound and no guarding. No hernia.  Neurological: She is alert and oriented to person, place, and time.  Skin: Skin is warm and dry.  Psychiatric: She has a normal mood and affect. Her behavior is normal.        Assessment & Plan:  Bloating, gassy, nauseated, burping-no significant relief with multiple trials of PPI.  Evaluation of gallbladder.  We will also check liver enzymes as well as pancreatic  enzymes.  Feeling hot all the time-we will check her thyroid as well as for anemia.  Depression with anxiety-discussed referral to psychiatry for further work-up and evaluation.  Since she is  off the fluoxetine and 60 mg was not effective we will try Effexor.  Also can refer her for therapy and counseling as well I think she would benefit from of our comprehensive approach overall.

## 2018-04-08 LAB — CBC WITH DIFFERENTIAL/PLATELET
BASOS PCT: 0.2 %
Basophils Absolute: 12 cells/uL (ref 0–200)
EOS ABS: 130 {cells}/uL (ref 15–500)
Eosinophils Relative: 2.2 %
HEMATOCRIT: 38.6 % (ref 35.0–45.0)
Hemoglobin: 12.9 g/dL (ref 11.7–15.5)
Lymphs Abs: 2272 cells/uL (ref 850–3900)
MCH: 30.2 pg (ref 27.0–33.0)
MCHC: 33.4 g/dL (ref 32.0–36.0)
MCV: 90.4 fL (ref 80.0–100.0)
MPV: 10.3 fL (ref 7.5–12.5)
Monocytes Relative: 8 %
NEUTROS PCT: 51.1 %
Neutro Abs: 3015 cells/uL (ref 1500–7800)
PLATELETS: 258 10*3/uL (ref 140–400)
RBC: 4.27 10*6/uL (ref 3.80–5.10)
RDW: 11.8 % (ref 11.0–15.0)
Total Lymphocyte: 38.5 %
WBC: 5.9 10*3/uL (ref 3.8–10.8)
WBCMIX: 472 {cells}/uL (ref 200–950)

## 2018-04-08 LAB — LIPASE: Lipase: 23 U/L (ref 7–60)

## 2018-04-08 LAB — COMPLETE METABOLIC PANEL WITH GFR
AG Ratio: 1.6 (calc) (ref 1.0–2.5)
ALT: 20 U/L (ref 6–29)
AST: 27 U/L (ref 10–30)
Albumin: 4.2 g/dL (ref 3.6–5.1)
Alkaline phosphatase (APISO): 39 U/L (ref 33–115)
BUN: 24 mg/dL (ref 7–25)
CALCIUM: 8.9 mg/dL (ref 8.6–10.2)
CO2: 26 mmol/L (ref 20–32)
Chloride: 104 mmol/L (ref 98–110)
Creat: 0.74 mg/dL (ref 0.50–1.10)
GFR, EST NON AFRICAN AMERICAN: 106 mL/min/{1.73_m2} (ref >=60)
GFR, Est African American: 123 mL/min/{1.73_m2} (ref >=60)
Globulin: 2.7 g/dL (calc) (ref 1.9–3.7)
Glucose, Bld: 96 mg/dL (ref 65–99)
POTASSIUM: 4.1 mmol/L (ref 3.5–5.3)
Sodium: 138 mmol/L (ref 135–146)
Total Bilirubin: 0.4 mg/dL (ref 0.2–1.2)
Total Protein: 6.9 g/dL (ref 6.1–8.1)

## 2018-04-08 LAB — TSH: TSH: 1.35 mIU/L

## 2018-04-09 ENCOUNTER — Ambulatory Visit (INDEPENDENT_AMBULATORY_CARE_PROVIDER_SITE_OTHER): Payer: Managed Care, Other (non HMO)

## 2018-04-09 DIAGNOSIS — R110 Nausea: Secondary | ICD-10-CM

## 2018-04-09 DIAGNOSIS — R142 Eructation: Secondary | ICD-10-CM

## 2018-04-09 DIAGNOSIS — R140 Abdominal distension (gaseous): Secondary | ICD-10-CM | POA: Diagnosis not present

## 2018-04-09 DIAGNOSIS — Z23 Encounter for immunization: Secondary | ICD-10-CM

## 2018-04-09 DIAGNOSIS — R6889 Other general symptoms and signs: Secondary | ICD-10-CM

## 2018-05-05 ENCOUNTER — Ambulatory Visit: Payer: Managed Care, Other (non HMO) | Admitting: Family Medicine

## 2018-05-05 NOTE — Progress Notes (Deleted)
   Subjective:    Patient ID: Shirley Ruiz, female    DOB: [birth year], 32 y.o.   MRN: [id number]  HPI 32 year old female is here today for follow-up.  When I last saw her she had 3 to 4 months of abdominal bloating and discomfort.  As well as some significant nausea we referred her for an ultrasound for further work-up and ultrasound looked reassuring with no gallbladder changes.  We recommended the possibility of GI referral and left her message but we did not hear back from her.   Review of Systems     Objective:   Physical Exam        Assessment & Plan:

## 2018-05-22 ENCOUNTER — Encounter: Payer: Self-pay | Admitting: Emergency Medicine

## 2018-05-22 ENCOUNTER — Other Ambulatory Visit (HOSPITAL_COMMUNITY)
Admission: RE | Admit: 2018-05-22 | Discharge: 2018-05-22 | Disposition: A | Discharge status: Home / Self Care | Payer: Managed Care, Other (non HMO) | Source: Ambulatory Visit | Attending: Family Medicine | Admitting: Family Medicine

## 2018-05-22 ENCOUNTER — Emergency Department
Admission: EM | Admit: 2018-05-22 | Discharge: 2018-05-22 | Disposition: A | Discharge status: Home / Self Care | Payer: Managed Care, Other (non HMO) | Source: Home / Self Care | Attending: Family Medicine | Admitting: Family Medicine

## 2018-05-22 DIAGNOSIS — Z711 Person with feared health complaint in whom no diagnosis is made: Secondary | ICD-10-CM | POA: Diagnosis not present

## 2018-05-22 DIAGNOSIS — Z202 Contact with and (suspected) exposure to infections with a predominantly sexual mode of transmission: Secondary | ICD-10-CM | POA: Insufficient documentation

## 2018-05-22 LAB — POCT URINALYSIS DIP (MANUAL ENTRY)
Bilirubin, UA: NEGATIVE
Blood, UA: NEGATIVE
Glucose, UA: NEGATIVE mg/dL
Nitrite, UA: NEGATIVE
Protein Ur, POC: NEGATIVE mg/dL
Spec Grav, UA: 1.01 (ref 1.010–1.025)
Urobilinogen, UA: 0.2 E.U./dL
pH, UA: 6.5 (ref 5.0–8.0)

## 2018-05-22 NOTE — ED Triage Notes (Signed)
Pt recently found out her partner has been not been faithful. She would like STD testing. Currently asymptomatic.

## 2018-05-22 NOTE — Discharge Instructions (Signed)
°  You will be notified of the test results in 2-3 days.  Please follow up with family medicine as needed.

## 2018-05-22 NOTE — ED Provider Notes (Signed)
[facility]    CSN: [id number] Arrival date & time: 05/22/18  1623     History   Chief Complaint Chief Complaint  Patient presents with  . std testing    HPI Shirley Ruiz is a 32 y.o. female.   HPI  Shirley Ruiz is a 32 y.o. female presenting to UC with concern for possible STD exposure. Pt notes she recently found out her now "ex-boyfriend" had been cheating on her with multiple other partners. Pt denies symptoms at this time but would like to be tested for STDs.    Past Medical History:  Diagnosis Date  . Abnormal Pap smear 6/10   .HGSIL, Colpo and Cryo  . Anxiety   . Asthma   . Depression   . Diabetes in pregnancy    Gestational  . Intervertebral disc protrusion   . Mental disorder    H/O depression  . Migraines   . PVC (premature ventricular contraction)     Patient Active Problem List   Diagnosis Date Noted  . Adjustment disorder with mixed anxiety and depressed mood 05/30/2013  . Panic attacks 05/30/2013  . Panic disorder without agoraphobia 05/30/2013  . Palpitations 02/25/2013  . Migraine without status migrainosus, not intractable 09/23/2012  . 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 DEPRESSION 09/23/2007  . ASTHMA, UNSPECIFIED, UNSPECIFIED STATUS 09/23/2007  . Uncomplicated asthma 09/23/2007  . Major depressive disorder, single episode 09/23/2007    Past Surgical History:  Procedure Laterality Date  . MOUTH SURGERY     Cut in mouth to remove an extra tooth  . NO PAST SURGERIES      OB History    Gravida  1   Para  1   Term  1   Preterm      AB      Living  1     SAB      TAB      Ectopic      Multiple      Live Births               Home Medications    Prior to Admission medications   Medication Sig Start Date End Date Taking? Authorizing Provider  phentermine 15 MG capsule Take 15 mg by mouth every morning.   Yes [provider]  Esomeprazole Magnesium (NEXIUM PO) Take by  mouth.    [provider]  FLUoxetine (PROZAC) 20 MG capsule Take 1 capsule (20 mg total) by mouth daily. Take with 40 mg capsule for total 60 mg daily dose 02/12/18   Sunnie Nielsen, DO  FLUoxetine (PROZAC) 40 MG capsule Take 1 capsule (40 mg total) by mouth daily. Take with 00 mg capsule for total 60 mg daily dose 02/12/18   Sunnie Nielsen, DO  ondansetron (ZOFRAN) 8 MG tablet Take 1 tablet (8 mg total) by mouth every 8 (eight) hours as needed. for nausea 04/24/15   Agapito Games, MD  SUMAtriptan (IMITREX) 100 MG tablet TAKE ONE TABLET BY MOUTH EVERY 2 HOURS AS NEEDED FOR MIGRAINE 12/02/17   Agapito Games, MD  venlafaxine XR (EFFEXOR XR) 37.5 MG 24 hr capsule Take 1 capsule (37.5 mg total) by mouth daily with breakfast. After one week increase to 2 tabs daily 04/07/18   Agapito Games, MD    Family History Family History  Problem Relation Age of Onset  . Depression Mother   . Hypertension Mother   . Heart disease Mother   . Depression Maternal Grandmother   .  Heart attack Father   . Heart disease Maternal Grandfather     Social History Social History   Tobacco Use  . Smoking status: Never Smoker  . Smokeless tobacco: Never Used  Substance Use Topics  . Alcohol use: Yes    Comment: occasionally as a social drinker  . Drug use: No     Allergies   Bupropion and Penicillins   Review of Systems Review of Systems  Constitutional: Negative for chills and fever.  Gastrointestinal: Negative for abdominal pain, diarrhea, nausea and vomiting.  Genitourinary: Negative for dysuria, vaginal bleeding, vaginal discharge and vaginal pain.     Physical Exam Triage Vital Signs ED Triage Vitals  Enc Vitals Group     BP 05/22/18 1714 134/84     Pulse Rate 05/22/18 1714 78     Resp --      Temp 05/22/18 1714 98 F (36.7 C)     Temp Source 05/22/18 1714 Oral     SpO2 05/22/18 1714 100 %     Weight 05/22/18 1715 132 lb (59.9 kg)     Height --       Head Circumference --      Peak Flow --      Pain Score 05/22/18 1715 0     Pain Loc --      Pain Edu? --      Excl. in GC? --    No data found.  Updated Vital Signs BP 134/84 (BP Location: Right Arm)   Pulse 78   Temp 98 F (36.7 C) (Oral)   Wt 132 lb (59.9 kg)   SpO2 100%   BMI 24.14 kg/m   Visual Acuity Right Eye Distance:   Left Eye Distance:   Bilateral Distance:    Right Eye Near:   Left Eye Near:    Bilateral Near:     Physical Exam  Constitutional: She is oriented to person, place, and time. She appears well-developed and well-nourished. No distress.  HENT:  Head: Normocephalic and atraumatic.  Mouth/Throat: Oropharynx is clear and moist.  Eyes: EOM are normal.  Neck: Normal range of motion.  Cardiovascular: Normal rate and regular rhythm.  Pulmonary/Chest: Effort normal and breath sounds normal. No respiratory distress.  Abdominal: Soft. She exhibits no distension. There is no tenderness. There is no CVA tenderness.  Genitourinary:  Genitourinary Comments: deferred  Musculoskeletal: Normal range of motion.  Neurological: She is alert and oriented to person, place, and time.  Skin: Skin is warm and dry. She is not diaphoretic.  Psychiatric: She has a normal mood and affect. Her behavior is normal.  Nursing note and vitals reviewed.    UC Treatments / Results  Labs (all labs ordered are listed, but only abnormal results are displayed) Labs Reviewed  HIV ANTIBODY (ROUTINE TESTING W REFLEX)  RPR  POCT URINALYSIS DIP (MANUAL ENTRY)  CERVICOVAGINAL ANCILLARY ONLY    EKG None  Radiology No results found.  Procedures Procedures (including critical care time)  Medications Ordered in UC Medications - No data to display  Initial Impression / Assessment and Plan / UC Course  I have reviewed the triage vital signs and the nursing notes.  Pertinent labs & imaging results that were available during my care of the patient were reviewed by me and  considered in my medical decision making (see chart for details).     UA: unremarkable Self swab for GC/chlamydia and wet prep sent Blood test for HIV and syphilis also sent to lab.  Final Clinical Impressions(s) / UC Diagnoses   Final diagnoses:  Concern about STD in female without diagnosis     Discharge Instructions      You will be notified of the test results in 2-3 days.  Please follow up with family medicine as needed.    ED Prescriptions    None     Controlled Substance Prescriptions [place] Controlled Substance Registry consulted? Not Applicable   Rolla Plate 05/22/18 1744

## 2018-05-25 LAB — HIV ANTIBODY (ROUTINE TESTING W REFLEX): HIV 1&2 Ab, 4th Generation: NONREACTIVE

## 2018-05-25 LAB — RPR: RPR Ser Ql: NONREACTIVE

## 2018-05-26 LAB — CERVICOVAGINAL ANCILLARY ONLY
Bacterial vaginitis: NEGATIVE
Candida vaginitis: POSITIVE — AB
Chlamydia: NEGATIVE
Neisseria Gonorrhea: NEGATIVE
Trichomonas: NEGATIVE

## 2018-05-27 ENCOUNTER — Telehealth: Payer: Self-pay

## 2018-05-27 MED ORDER — FLUCONAZOLE 150 MG PO TABS: 150.0000 mg | ORAL_TABLET | Freq: Every day | ORAL | 1 refills | Status: DC

## 2018-05-27 NOTE — Telephone Encounter (Signed)
Notified patient of lab results and script for yeast infection sent to [facility] in [place].

## 2018-06-08 ENCOUNTER — Other Ambulatory Visit: Payer: Self-pay | Admitting: Family Medicine

## 2018-06-08 DIAGNOSIS — F4323 Adjustment disorder with mixed anxiety and depressed mood: Secondary | ICD-10-CM

## 2018-08-05 ENCOUNTER — Other Ambulatory Visit: Payer: Self-pay | Admitting: Family Medicine

## 2018-08-05 DIAGNOSIS — F4323 Adjustment disorder with mixed anxiety and depressed mood: Secondary | ICD-10-CM

## 2018-10-08 ENCOUNTER — Other Ambulatory Visit: Payer: Self-pay | Admitting: Family Medicine

## 2018-10-08 DIAGNOSIS — F4323 Adjustment disorder with mixed anxiety and depressed mood: Secondary | ICD-10-CM

## 2019-07-09 LAB — NOVEL CORONAVIRUS, NAA: SARS-CoV-2, NAA: POSITIVE

## 2019-07-22 ENCOUNTER — Encounter: Payer: Self-pay | Admitting: Family Medicine

## 2019-07-27 ENCOUNTER — Telehealth: Payer: Self-pay | Admitting: Family Medicine

## 2019-07-27 NOTE — Telephone Encounter (Signed)
Patient called back and left a message that she does not need to have any paperwork filled out at this time as it has already been approved. You can disregard the FMLA in your box.

## 2019-10-11 ENCOUNTER — Encounter: Payer: Self-pay | Admitting: Family Medicine

## 2019-10-11 ENCOUNTER — Ambulatory Visit (INDEPENDENT_AMBULATORY_CARE_PROVIDER_SITE_OTHER): Payer: Managed Care, Other (non HMO) | Admitting: Obstetrics & Gynecology

## 2019-10-11 ENCOUNTER — Other Ambulatory Visit: Payer: Self-pay

## 2019-10-11 ENCOUNTER — Ambulatory Visit (INDEPENDENT_AMBULATORY_CARE_PROVIDER_SITE_OTHER): Payer: Managed Care, Other (non HMO) | Admitting: Family Medicine

## 2019-10-11 ENCOUNTER — Encounter: Payer: Self-pay | Admitting: Obstetrics & Gynecology

## 2019-10-11 VITALS — BP 105/62 | HR 68 | Ht 62.0 in | Wt 139.0 lb

## 2019-10-11 VITALS — BP 114/70 | HR 79 | Resp 16 | Ht 62.0 in | Wt 141.0 lb

## 2019-10-11 DIAGNOSIS — Z113 Encounter for screening for infections with a predominantly sexual mode of transmission: Secondary | ICD-10-CM

## 2019-10-11 DIAGNOSIS — Z0000 Encounter for general adult medical examination without abnormal findings: Secondary | ICD-10-CM

## 2019-10-11 DIAGNOSIS — Z01419 Encounter for gynecological examination (general) (routine) without abnormal findings: Secondary | ICD-10-CM | POA: Diagnosis not present

## 2019-10-11 DIAGNOSIS — Z1151 Encounter for screening for human papillomavirus (HPV): Secondary | ICD-10-CM

## 2019-10-11 DIAGNOSIS — B373 Candidiasis of vulva and vagina: Secondary | ICD-10-CM

## 2019-10-11 DIAGNOSIS — F4323 Adjustment disorder with mixed anxiety and depressed mood: Secondary | ICD-10-CM | POA: Diagnosis not present

## 2019-10-11 DIAGNOSIS — L709 Acne, unspecified: Secondary | ICD-10-CM | POA: Diagnosis not present

## 2019-10-11 DIAGNOSIS — Z124 Encounter for screening for malignant neoplasm of cervix: Secondary | ICD-10-CM

## 2019-10-11 DIAGNOSIS — Z30431 Encounter for routine checking of intrauterine contraceptive device: Secondary | ICD-10-CM

## 2019-10-11 DIAGNOSIS — J4530 Mild persistent asthma, uncomplicated: Secondary | ICD-10-CM | POA: Diagnosis not present

## 2019-10-11 DIAGNOSIS — J45909 Unspecified asthma, uncomplicated: Secondary | ICD-10-CM

## 2019-10-11 DIAGNOSIS — N760 Acute vaginitis: Secondary | ICD-10-CM

## 2019-10-11 DIAGNOSIS — Z23 Encounter for immunization: Secondary | ICD-10-CM

## 2019-10-11 DIAGNOSIS — N898 Other specified noninflammatory disorders of vagina: Secondary | ICD-10-CM

## 2019-10-11 DIAGNOSIS — R0602 Shortness of breath: Secondary | ICD-10-CM | POA: Diagnosis not present

## 2019-10-11 DIAGNOSIS — R202 Paresthesia of skin: Secondary | ICD-10-CM

## 2019-10-11 DIAGNOSIS — R230 Cyanosis: Secondary | ICD-10-CM

## 2019-10-11 DIAGNOSIS — B9689 Other specified bacterial agents as the cause of diseases classified elsewhere: Secondary | ICD-10-CM | POA: Diagnosis not present

## 2019-10-11 DIAGNOSIS — B3731 Acute candidiasis of vulva and vagina: Secondary | ICD-10-CM

## 2019-10-11 DIAGNOSIS — G43009 Migraine without aura, not intractable, without status migrainosus: Secondary | ICD-10-CM

## 2019-10-11 MED ORDER — ONDANSETRON HCL 8 MG PO TABS: 8.0000 mg | ORAL_TABLET | Freq: Three times a day (TID) | ORAL | 1 refills | Status: DC | PRN

## 2019-10-11 MED ORDER — VENLAFAXINE HCL ER 37.5 MG PO CP24: 37.5000 mg | ORAL_CAPSULE | Freq: Two times a day (BID) | ORAL | 1 refills | Status: DC

## 2019-10-11 MED ORDER — ALBUTEROL SULFATE HFA 108 (90 BASE) MCG/ACT IN AERS: 2.0000 | INHALATION_SPRAY | Freq: Four times a day (QID) | RESPIRATORY_TRACT | 1 refills | Status: DC | PRN

## 2019-10-11 MED ORDER — CLONAZEPAM 0.5 MG PO TABS: 0.5000 mg | ORAL_TABLET | Freq: Every day | ORAL | 0 refills | Status: DC | PRN

## 2019-10-11 MED ORDER — SUMATRIPTAN SUCCINATE 100 MG PO TABS: ORAL_TABLET | ORAL | 5 refills | Status: DC

## 2019-10-11 MED ORDER — TRETINOIN 0.025 % EX GEL: Freq: Every day | CUTANEOUS | 1 refills | Status: DC

## 2019-10-11 NOTE — Progress Notes (Signed)
Subjective:     Shirley Ruiz is a 34 y.o. female and is here for a comprehensive physical exam. The patient reports problems - change in mood.  She is actually also scheduled for her Pap smear with her OB/GYN today.  Her tetanus vaccine is up-to-date.  She has been out of her antidepressant medication for quite some time and says she is starting to notice a change.  She is really lost her motivation to get up and do things and be social and interactive.  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  She just feels like this is been particularly challenging.  She would like to restart the Effexor and would just like a small supply of her clonazepam which we had not filled in a very long time.  She reports she is having sensations of just feeling very overwhelmed most like a panic attack.  She will feel like things are and is physically pressing in on her and then will feel overly emotional.  She also reports some numbness and tingling particularly in her heels.  She notices it more in the evening with lying down or with standing.  She does not notice it quite as much with sitting.  She says that she is also noticed that her great toes will turn purple sometimes particularly at in the day she will take her shoes off and noticed that.  The toes have been changing color for probably over a year but she is just noticed that it is been much more frequent over the winter.  Asthma-she says she is also had to use her albuterol a little bit more frequently since having to wear masks at work and would like a refill on her medication.  Also from wearing her mask has been breaking out with some pretty deep acne particularly on her chin and facial cheeks.  She is tried several over-the-counter products but it just does not seem to be helping.  She wants to know what the alternatives would be.  Social History    Socioeconomic History  . Marital status: Divorced    Spouse name: Not on file  . Number of children: 1  . Years of education: Not on file  . Highest education level: Not on file  Occupational History  . Not on file  Tobacco Use  . Smoking status: Never Smoker  . Smokeless tobacco: Never Used  Substance and Sexual Activity  . Alcohol use: Yes    Comment: occasionally as a social drinker  . Drug use: No  . Sexual activity: Yes    Partners: Male    Birth control/protection: I.U.D.  Other Topics Concern  . Not on file  Social History Narrative   Regular exercise.  Works at [employer]. She is back in school to get a college degree.  Raising her daughter by herself.     Social Determinants of Health   Financial Resource Strain:   . Difficulty of Paying Living Expenses: Not on file  Food Insecurity:   . Worried About Charity fundraiser in the Last Year: Not on file  . Ran Out of Food in the Last Year: Not on file  Transportation Needs:   . Lack of Transportation (Medical): Not on file  . Lack of Transportation (Non-Medical): Not on file  Physical Activity:   . Days of Exercise per  Week: Not on file  . Minutes of Exercise per Session: Not on file  Stress:   . Feeling of Stress : Not on file  Social Connections:   . Frequency of Communication with Friends and Family: Not on file  . Frequency of Social Gatherings with Friends and Family: Not on file  . Attends Religious Services: Not on file  . Active Member of Clubs or Organizations: Not on file  . Attends Banker Meetings: Not on file  . Marital Status: Not on file  Intimate Partner Violence:   . Fear of Current or Ex-Partner: Not on file  . Emotionally Abused: Not on file  . Physically Abused: Not on file  . Sexually Abused: Not on file   Health Maintenance  Topic Date Due  . PAP SMEAR-Modifier  03/26/2019  . TETANUS/TDAP  08/15/2021  . INFLUENZA VACCINE  Completed  . HIV Screening  Completed     The following portions of the patient's history were reviewed and updated as appropriate: allergies, current medications, past family history, past medical history, past social history, past surgical history and problem list.  Review of Systems A comprehensive review of systems was negative.   Objective:    BP 105/62   Pulse 68   Ht 5\' 2"  (1.575 m)   Wt 139 lb (63 kg)   SpO2 100%   BMI 25.42 kg/m  General appearance: alert, cooperative and appears stated age Head: Normocephalic, without obvious abnormality, atraumatic Eyes: conj clear, EOMI<, PEERLA Ears: normal TM's and external ear canals both ears Nose: Nares normal. Septum midline. Mucosa normal. No drainage or sinus tenderness. Throat: lips, mucosa, and tongue normal; teeth and gums normal Neck: no adenopathy, no carotid bruit, no JVD, supple, symmetrical, trachea midline and thyroid not enlarged, symmetric, no tenderness/mass/nodules Lungs: clear to auscultation bilaterally Heart: regular rate and rhythm, S1, S2 normal, no murmur, click, rub or gallop Abdomen: soft, non-tender; bowel sounds normal; no masses,  no organomegaly Extremities: extremities normal, atraumatic, no cyanosis or edema Pulses: 2+ and symmetric Skin: Skin color, texture, turgor normal. No rashes or lesions Lymph nodes: Cervical adenopathy: nl and Supraclavicular adenopathy: nl Neurologic: Alert and oriented X 3, normal strength and tone. Normal symmetric reflexes. Normal coordination and gait    Assessment:    Healthy female exam.      Plan:     See After Visit Summary for Counseling Recommendations   Keep up a regular exercise program and make sure you are eating a healthy diet Try to eat 4 servings of dairy a day, or if you are lactose intolerant take a calcium with vitamin D daily.  Your vaccines are up to date.  We will get Pap smear today with her OB/GYN.  She has had some vulvar itching but will discuss with her GYN today. She would  like to have STD screening so we will go ahead and do HIV and RPR on her blood work and then let her GYN do gonorrhea chlamydia etc.  Blue toes-sounds like it could be Raynaud's.  We will check a few labs just to rule out other underlying autoimmune causes but we could certainly discuss treatment options with calcium channel blockers etc. for possible Raynaud's.  Adjustment disorder with mixed anxiety and panic attacks-we will restart her venlafaxine and also give her a short supply clonazepam.  If she still feels more withdrawn and unmotivated then please let know.  Uncomplicated asthma Refilled her albuterol for as needed use.  She  is just noticed that the frequency has ramped up since wearing a mask she does feel like at times she cannot breathe and will have to use it at work.  Adjustment disorder with mixed anxiety and depressed mood We will restart venlafaxine she feels like she did a little better than on that when in the fluoxetine.  I did refill just a short supply the clonazepam.  Is reminded her to use very sparingly.  Migraine without status migrainosus, not intractable Also requested refills on her Imitrex.  Prescription sent to pharmacy.  Acne from wearing her mask-just discussed making sure that she is wearing a clean fresh mass daily avoid washing it with fabric softeners and scent beads etc.  Also moisturizing the skin well.  We will try to do a retinoid topical product.

## 2019-10-11 NOTE — Assessment & Plan Note (Signed)
Refilled her albuterol for as needed use.  She is just noticed that the frequency has ramped up since wearing a mask she does feel like at times she cannot breathe and will have to use it at work.

## 2019-10-11 NOTE — Patient Instructions (Signed)
Preventive Care 21-36 Years Old, Female Preventive care refers to visits with your health care provider and lifestyle choices that can promote health and wellness. This includes:  A yearly physical exam. This may also be called an annual well check.  Regular dental visits and eye exams.  Immunizations.  Screening for certain conditions.  Healthy lifestyle choices, such as eating a healthy diet, getting regular exercise, not using drugs or products that contain nicotine and tobacco, and limiting alcohol use. What can I expect for my preventive care visit? Physical exam Your health care provider will check your:  Height and weight. This may be used to calculate body mass index (BMI), which tells if you are at a healthy weight.  Heart rate and blood pressure.  Skin for abnormal spots. Counseling Your health care provider may ask you questions about your:  Alcohol, tobacco, and drug use.  Emotional well-being.  Home and relationship well-being.  Sexual activity.  Eating habits.  Work and work environment.  Method of birth control.  Menstrual cycle.  Pregnancy history. What immunizations do I need?  Influenza (flu) vaccine  This is recommended every year. Tetanus, diphtheria, and pertussis (Tdap) vaccine  You may need a Td booster every 10 years. Varicella (chickenpox) vaccine  You may need this if you have not been vaccinated. Human papillomavirus (HPV) vaccine  If recommended by your health care provider, you may need three doses over 6 months. Measles, mumps, and rubella (MMR) vaccine  You may need at least one dose of MMR. You may also need a second dose. Meningococcal conjugate (MenACWY) vaccine  One dose is recommended if you are age 19-21 years and a first-year college student living in a residence hall, or if you have one of several medical conditions. You may also need additional booster doses. Pneumococcal conjugate (PCV13) vaccine  You may need  this if you have certain conditions and were not previously vaccinated. Pneumococcal polysaccharide (PPSV23) vaccine  You may need one or two doses if you smoke cigarettes or if you have certain conditions. Hepatitis A vaccine  You may need this if you have certain conditions or if you travel or work in places where you may be exposed to hepatitis A. Hepatitis B vaccine  You may need this if you have certain conditions or if you travel or work in places where you may be exposed to hepatitis B. Haemophilus influenzae type b (Hib) vaccine  You may need this if you have certain conditions. You may receive vaccines as individual doses or as more than one vaccine together in one shot (combination vaccines). Talk with your health care provider about the risks and benefits of combination vaccines. What tests do I need?  Blood tests  Lipid and cholesterol levels. These may be checked every 5 years starting at age 20.  Hepatitis C test.  Hepatitis B test. Screening  Diabetes screening. This is done by checking your blood sugar (glucose) after you have not eaten for a while (fasting).  Sexually transmitted disease (STD) testing.  BRCA-related cancer screening. This may be done if you have a family history of breast, ovarian, tubal, or peritoneal cancers.  Pelvic exam and Pap test. This may be done every 3 years starting at age 21. Starting at age 30, this may be done every 5 years if you have a Pap test in combination with an HPV test. Talk with your health care provider about your test results, treatment options, and if necessary, the need for more tests.   Follow these instructions at home: Eating and drinking   Eat a diet that includes fresh fruits and vegetables, whole grains, lean protein, and low-fat dairy.  Take vitamin and mineral supplements as recommended by your health care provider.  Do not drink alcohol if: ? Your health care provider tells you not to drink. ? You are  pregnant, may be pregnant, or are planning to become pregnant.  If you drink alcohol: ? Limit how much you have to 0-1 drink a day. ? Be aware of how much alcohol is in your drink. In the [place] one drink equals one 12 oz bottle of beer (355 mL), one 5 oz glass of wine (148 mL), or one 1 oz glass of hard liquor (44 mL). Lifestyle  Take daily care of your teeth and gums.  Stay active. Exercise for at least 30 minutes on 5 or more days each week.  Do not use any products that contain nicotine or tobacco, such as cigarettes, e-cigarettes, and chewing tobacco. If you need help quitting, ask your health care provider.  If you are sexually active, practice safe sex. Use a condom or other form of birth control (contraception) in order to prevent pregnancy and STIs (sexually transmitted infections). If you plan to become pregnant, see your health care provider for a preconception visit. What's next?  Visit your health care provider once a year for a well check visit.  Ask your health care provider how often you should have your eyes and teeth checked.  Stay up to date on all vaccines. This information is not intended to replace advice given to you by your health care provider. Make sure you discuss any questions you have with your health care provider. Document Revised: 04/02/2018 Document Reviewed: 04/02/2018 Elsevier Patient Education  2020 [employer].

## 2019-10-11 NOTE — Progress Notes (Signed)
GYNECOLOGY ANNUAL PREVENTATIVE CARE ENCOUNTER NOTE  History:     Shirley Ruiz is a 34 y.o. G42P1001 female here for a routine annual gynecologic exam.  Current complaints: none.  Desires annual STI screening.   Denies abnormal vaginal bleeding, discharge, pelvic pain, problems with intercourse or other gynecologic concerns.    Gynecologic History No LMP recorded. (Menstrual status: IUD). Mirena placed 04/2016.  Contraception: IUD Last Pap: 2017. Results were: normal with negative HPV. History of cryotherapy for cervical dysplasia.  Obstetric History OB History  Gravida Para Term Preterm AB Living  1 1 1     1   SAB TAB Ectopic Multiple Live Births               # Outcome Date GA Lbr Len/2nd Weight Sex Delivery Anes PTL Lv  1 Term             Past Medical History:  Diagnosis Date  . Abnormal Pap smear 6/10   .HGSIL, [place] and Cryo  . Anxiety   . Asthma   . Depression   . Diabetes in pregnancy    Gestational  . Intervertebral disc protrusion   . Mental disorder    H/O depression  . Migraines   . PVC (premature ventricular contraction)     Past Surgical History:  Procedure Laterality Date  . CRYOTHERAPY     For cervical dysplasia  . MOUTH SURGERY     Cut in mouth to remove an extra tooth    Current Outpatient Medications on File Prior to Visit  Medication Sig Dispense Refill  . ondansetron (ZOFRAN) 8 MG tablet Take 1 tablet (8 mg total) by mouth every 8 (eight) hours as needed. for nausea 12 tablet 1  . phentermine 15 MG capsule Take 15 mg by mouth every morning.    . SUMAtriptan (IMITREX) 100 MG tablet TAKE ONE TABLET BY MOUTH EVERY 2 HOURS AS NEEDED FOR MIGRAINE 10 tablet 5  . albuterol (VENTOLIN HFA) 108 (90 Base) MCG/ACT inhaler Inhale 2 puffs into the lungs every 6 (six) hours as needed for wheezing or shortness of breath. (Patient not taking: Reported on 10/11/2019) 8 g 1  . clonazePAM (KLONOPIN) 0.5 MG tablet Take 1 tablet (0.5 mg total) by mouth daily as  needed. for anxiety (Patient not taking: Reported on 10/11/2019) 20 tablet 0  . Esomeprazole Magnesium (NEXIUM PO) Take by mouth.    . venlafaxine XR (EFFEXOR-XR) 37.5 MG 24 hr capsule Take 1 capsule (37.5 mg total) by mouth 2 (two) times daily. (Patient not taking: Reported on 10/11/2019) 180 capsule 1   No current facility-administered medications on file prior to visit.    Allergies  Allergen Reactions  . Bupropion Other (See Comments) and Rash    Increased PVCs Increased PVCs Increased PVCs Increased PVCs  . Penicillins Hives    Social History:  reports that she has never smoked. She has never used smokeless tobacco. She reports current alcohol use. She reports that she does not use drugs.  Family History  Problem Relation Age of Onset  . Depression Mother   . Hypertension Mother   . Heart disease Mother   . Depression Maternal Grandmother   . Heart attack Father   . Heart disease Maternal Grandfather     The following portions of the patient's history were reviewed and updated as appropriate: allergies, current medications, past family history, past medical history, past social history, past surgical history and problem list.  Review of Systems Pertinent items  noted in HPI and remainder of comprehensive ROS otherwise negative.  Physical Exam:  BP 114/70   Pulse 79   Resp 16   Ht 5\' 2"  (1.575 m)   Wt 141 lb (64 kg)   BMI 25.79 kg/m  CONSTITUTIONAL: Well-developed, well-nourished female in no acute distress.  HENT:  Normocephalic, atraumatic, External right and left ear normal. Oropharynx is clear and moist EYES: Conjunctivae and EOM are normal. Pupils are equal, round, and reactive to light. No scleral icterus.  NECK: Normal range of motion, supple, no masses.  Normal thyroid.  SKIN: Skin is warm and dry. No rash noted. Not diaphoretic. No erythema. No pallor. MUSCULOSKELETAL: Normal range of motion. No tenderness.  No cyanosis, clubbing, or edema.  2+ distal  pulses. NEUROLOGIC: Alert and oriented to person, place, and time. Normal reflexes, muscle tone coordination.  PSYCHIATRIC: Normal mood and affect. Normal behavior. Normal judgment and thought content. CARDIOVASCULAR: Normal heart rate noted, regular rhythm RESPIRATORY: Clear to auscultation bilaterally. Effort and breath sounds normal, no problems with respiration noted. BREASTS: Symmetric in size. No masses, tenderness, skin changes, nipple drainage, or lymphadenopathy bilaterally. ABDOMEN: Soft, no distention noted.  No tenderness, rebound or guarding.  PELVIC: Normal appearing external genitalia and urethral meatus; normal appearing vaginal mucosa and cervix.  Mirena IUD strings visualized about 2 cm in length from external os. Thick, yellow discharge noted and testing sample obtained.  Pap smear obtained.  Normal uterine size, no other palpable masses, no uterine or adnexal tenderness.   Assessment and Plan:    1. IUD check up Doing well.  Can keep in place until 2024 or remove earlier if desired.   2. Well woman exam with routine gynecological exam - Cytology - PAP( [place]) - Cervicovaginal ancillary only( [place]) - Flu Vaccine QUAD 36+ mos IM (Fluarix, Quad PF) - Hepatitis B surface antigen - Hepatitis C antibody Will follow up results of pap smear and manage accordingly. Routine preventative health maintenance measures emphasized. Please refer to After Visit Summary for other counseling recommendations.      Verita Schneiders, MD, [place] for [facility], [place]

## 2019-10-11 NOTE — Assessment & Plan Note (Signed)
We will restart venlafaxine she feels like she did a little better than on that when in the fluoxetine.  I did refill just a short supply the clonazepam.  Is reminded her to use very sparingly.

## 2019-10-11 NOTE — Patient Instructions (Signed)
 Preventive Care 21-36 Years Old, Female Preventive care refers to visits with your health care provider and lifestyle choices that can promote health and wellness. This includes:  A yearly physical exam. This may also be called an annual well check.  Regular dental visits and eye exams.  Immunizations.  Screening for certain conditions.  Healthy lifestyle choices, such as eating a healthy diet, getting regular exercise, not using drugs or products that contain nicotine and tobacco, and limiting alcohol use. What can I expect for my preventive care visit? Physical exam Your health care provider will check your:  Height and weight. This may be used to calculate body mass index (BMI), which tells if you are at a healthy weight.  Heart rate and blood pressure.  Skin for abnormal spots. Counseling Your health care provider may ask you questions about your:  Alcohol, tobacco, and drug use.  Emotional well-being.  Home and relationship well-being.  Sexual activity.  Eating habits.  Work and work environment.  Method of birth control.  Menstrual cycle.  Pregnancy history. What immunizations do I need?  Influenza (flu) vaccine  This is recommended every year. Tetanus, diphtheria, and pertussis (Tdap) vaccine  You may need a Td booster every 10 years. Varicella (chickenpox) vaccine  You may need this if you have not been vaccinated. Human papillomavirus (HPV) vaccine  If recommended by your health care provider, you may need three doses over 6 months. Measles, mumps, and rubella (MMR) vaccine  You may need at least one dose of MMR. You may also need a second dose. Meningococcal conjugate (MenACWY) vaccine  One dose is recommended if you are age 19-21 years and a first-year college student living in a residence hall, or if you have one of several medical conditions. You may also need additional booster doses. Pneumococcal conjugate (PCV13) vaccine  You may need  this if you have certain conditions and were not previously vaccinated. Pneumococcal polysaccharide (PPSV23) vaccine  You may need one or two doses if you smoke cigarettes or if you have certain conditions. Hepatitis A vaccine  You may need this if you have certain conditions or if you travel or work in places where you may be exposed to hepatitis A. Hepatitis B vaccine  You may need this if you have certain conditions or if you travel or work in places where you may be exposed to hepatitis B. Haemophilus influenzae type b (Hib) vaccine  You may need this if you have certain conditions. You may receive vaccines as individual doses or as more than one vaccine together in one shot (combination vaccines). Talk with your health care provider about the risks and benefits of combination vaccines. What tests do I need?  Blood tests  Lipid and cholesterol levels. These may be checked every 5 years starting at age 20.  Hepatitis C test.  Hepatitis B test. Screening  Diabetes screening. This is done by checking your blood sugar (glucose) after you have not eaten for a while (fasting).  Sexually transmitted disease (STD) testing.  BRCA-related cancer screening. This may be done if you have a family history of breast, ovarian, tubal, or peritoneal cancers.  Pelvic exam and Pap test. This may be done every 3 years starting at age 21. Starting at age 30, this may be done every 5 years if you have a Pap test in combination with an HPV test. Talk with your health care provider about your test results, treatment options, and if necessary, the need for more   tests. Follow these instructions at home: Eating and drinking   Eat a diet that includes fresh fruits and vegetables, whole grains, lean protein, and low-fat dairy.  Take vitamin and mineral supplements as recommended by your health care provider.  Do not drink alcohol if: ? Your health care provider tells you not to drink. ? You are  pregnant, may be pregnant, or are planning to become pregnant.  If you drink alcohol: ? Limit how much you have to 0-1 drink a day. ? Be aware of how much alcohol is in your drink. In the [place] one drink equals one 12 oz bottle of beer (355 mL), one 5 oz glass of wine (148 mL), or one 1 oz glass of hard liquor (44 mL). Lifestyle  Take daily care of your teeth and gums.  Stay active. Exercise for at least 30 minutes on 5 or more days each week.  Do not use any products that contain nicotine or tobacco, such as cigarettes, e-cigarettes, and chewing tobacco. If you need help quitting, ask your health care provider.  If you are sexually active, practice safe sex. Use a condom or other form of birth control (contraception) in order to prevent pregnancy and STIs (sexually transmitted infections). If you plan to become pregnant, see your health care provider for a preconception visit. What's next?  Visit your health care provider once a year for a well check visit.  Ask your health care provider how often you should have your eyes and teeth checked.  Stay up to date on all vaccines. This information is not intended to replace advice given to you by your health care provider. Make sure you discuss any questions you have with your health care provider. Document Revised: 04/02/2018 Document Reviewed: 04/02/2018 Elsevier Patient Education  2020 [employer]  

## 2019-10-11 NOTE — Assessment & Plan Note (Signed)
Also requested refills on her Imitrex.  Prescription sent to pharmacy.

## 2019-10-12 LAB — CERVICOVAGINAL ANCILLARY ONLY
Bacterial Vaginitis (gardnerella): POSITIVE — AB
Candida Glabrata: NEGATIVE
Candida Vaginitis: POSITIVE — AB
Chlamydia: NEGATIVE
Comment: NEGATIVE
Comment: NEGATIVE
Comment: NEGATIVE
Comment: NEGATIVE
Comment: NEGATIVE
Comment: NORMAL
Neisseria Gonorrhea: NEGATIVE
Trichomonas: NEGATIVE

## 2019-10-12 LAB — HEPATITIS B SURFACE ANTIGEN: Hepatitis B Surface Ag: NONREACTIVE

## 2019-10-12 LAB — HEPATITIS C ANTIBODY
Hepatitis C Ab: NONREACTIVE
SIGNAL TO CUT-OFF: 0.02 (ref <1.00)

## 2019-10-12 MED ORDER — METRONIDAZOLE 500 MG PO TABS: 500.0000 mg | ORAL_TABLET | Freq: Two times a day (BID) | ORAL | 3 refills | Status: DC

## 2019-10-12 MED ORDER — FLUCONAZOLE 150 MG PO TABS: 150.0000 mg | ORAL_TABLET | ORAL | 3 refills | Status: DC

## 2019-10-12 NOTE — Addendum Note (Signed)
Addended by: Jaynie Collins A on: 10/12/2019 04:06 PM   Modules accepted: Orders

## 2019-10-13 ENCOUNTER — Encounter: Payer: Self-pay | Admitting: Family Medicine

## 2019-10-13 DIAGNOSIS — I7300 Raynaud's syndrome without gangrene: Secondary | ICD-10-CM | POA: Insufficient documentation

## 2019-10-14 LAB — CYTOLOGY - PAP
Comment: NEGATIVE
Diagnosis: NEGATIVE
High risk HPV: NEGATIVE

## 2019-10-15 LAB — COMPLETE METABOLIC PANEL WITH GFR
AG Ratio: 1.6 (calc) (ref 1.0–2.5)
ALT: 18 U/L (ref 6–29)
AST: 22 U/L (ref 10–30)
Albumin: 4.5 g/dL (ref 3.6–5.1)
Alkaline phosphatase (APISO): 39 U/L (ref 31–125)
BUN: 11 mg/dL (ref 7–25)
CO2: 27 mmol/L (ref 20–32)
Calcium: 9.5 mg/dL (ref 8.6–10.2)
Chloride: 103 mmol/L (ref 98–110)
Creat: 0.72 mg/dL (ref 0.50–1.10)
GFR, Est African American: 126 mL/min/{1.73_m2} (ref >=60)
GFR, Est Non African American: 108 mL/min/{1.73_m2} (ref >=60)
Globulin: 2.9 g/dL (calc) (ref 1.9–3.7)
Glucose, Bld: 89 mg/dL (ref 65–99)
Potassium: 4.6 mmol/L (ref 3.5–5.3)
Sodium: 142 mmol/L (ref 135–146)
Total Bilirubin: 0.4 mg/dL (ref 0.2–1.2)
Total Protein: 7.4 g/dL (ref 6.1–8.1)

## 2019-10-15 LAB — CBC WITH DIFFERENTIAL/PLATELET
Absolute Monocytes: 437 cells/uL (ref 200–950)
Basophils Absolute: 22 cells/uL (ref 0–200)
Basophils Relative: 0.4 %
Eosinophils Absolute: 108 cells/uL (ref 15–500)
Eosinophils Relative: 2 %
HCT: 42.9 % (ref 35.0–45.0)
Hemoglobin: 14.3 g/dL (ref 11.7–15.5)
Lymphs Abs: 2241 cells/uL (ref 850–3900)
MCH: 30 pg (ref 27.0–33.0)
MCHC: 33.3 g/dL (ref 32.0–36.0)
MCV: 90.1 fL (ref 80.0–100.0)
MPV: 10.6 fL (ref 7.5–12.5)
Monocytes Relative: 8.1 %
Neutro Abs: 2592 cells/uL (ref 1500–7800)
Neutrophils Relative %: 48 %
Platelets: 285 10*3/uL (ref 140–400)
RBC: 4.76 10*6/uL (ref 3.80–5.10)
RDW: 11.4 % (ref 11.0–15.0)
Total Lymphocyte: 41.5 %
WBC: 5.4 10*3/uL (ref 3.8–10.8)

## 2019-10-15 LAB — LIPID PANEL
Cholesterol: 153 mg/dL (ref <200)
HDL: 67 mg/dL (ref >=50)
LDL Cholesterol (Calc): 74 mg/dL (calc)
Non-HDL Cholesterol (Calc): 86 mg/dL (calc) (ref <130)
Total CHOL/HDL Ratio: 2.3 (calc) (ref <5.0)
Triglycerides: 50 mg/dL (ref <150)

## 2019-10-15 LAB — RPR: RPR Ser Ql: NONREACTIVE

## 2019-10-15 LAB — C-REACTIVE PROTEIN: CRP: 2.1 mg/L (ref <8.0)

## 2019-10-15 LAB — VITAMIN B12: Vitamin B-12: 991 pg/mL (ref 200–1100)

## 2019-10-15 LAB — HIV ANTIBODY (ROUTINE TESTING W REFLEX): HIV 1&2 Ab, 4th Generation: NONREACTIVE

## 2019-10-15 LAB — SEDIMENTATION RATE: Sed Rate: 9 mm/h (ref 0–20)

## 2019-10-15 LAB — TSH: TSH: 2.52 mIU/L

## 2020-04-14 ENCOUNTER — Other Ambulatory Visit: Payer: Self-pay

## 2020-04-14 ENCOUNTER — Encounter: Payer: Self-pay | Admitting: Family Medicine

## 2020-04-14 ENCOUNTER — Ambulatory Visit (INDEPENDENT_AMBULATORY_CARE_PROVIDER_SITE_OTHER): Payer: Managed Care, Other (non HMO) | Admitting: Family Medicine

## 2020-04-14 VITALS — BP 132/77 | HR 93 | Ht 62.0 in | Wt 134.0 lb

## 2020-04-14 DIAGNOSIS — F4323 Adjustment disorder with mixed anxiety and depressed mood: Secondary | ICD-10-CM | POA: Diagnosis not present

## 2020-04-14 DIAGNOSIS — J4541 Moderate persistent asthma with (acute) exacerbation: Secondary | ICD-10-CM | POA: Insufficient documentation

## 2020-04-14 DIAGNOSIS — G43009 Migraine without aura, not intractable, without status migrainosus: Secondary | ICD-10-CM | POA: Diagnosis not present

## 2020-04-14 MED ORDER — ONDANSETRON HCL 8 MG PO TABS: 8.0000 mg | ORAL_TABLET | Freq: Three times a day (TID) | ORAL | 1 refills | Status: DC | PRN

## 2020-04-14 MED ORDER — MONTELUKAST SODIUM 10 MG PO TABS: 10.0000 mg | ORAL_TABLET | Freq: Every day | ORAL | 3 refills | Status: DC

## 2020-04-14 MED ORDER — PREDNISONE 20 MG PO TABS: 40.0000 mg | ORAL_TABLET | Freq: Every day | ORAL | 0 refills | Status: DC

## 2020-04-14 MED ORDER — FLUTICASONE-SALMETEROL 250-50 MCG/DOSE IN AEPB: 1.0000 | INHALATION_SPRAY | Freq: Two times a day (BID) | RESPIRATORY_TRACT | 1 refills | Status: DC

## 2020-04-14 MED ORDER — CLONAZEPAM 0.5 MG PO TABS: 0.5000 mg | ORAL_TABLET | Freq: Every day | ORAL | 0 refills | Status: DC | PRN

## 2020-04-14 MED ORDER — HYDROCODONE-HOMATROPINE 5-1.5 MG/5ML PO SYRP: 5.0000 mL | ORAL_SOLUTION | Freq: Every evening | ORAL | 0 refills | Status: DC | PRN

## 2020-04-14 MED ORDER — VENLAFAXINE HCL ER 37.5 MG PO CP24: 37.5000 mg | ORAL_CAPSULE | Freq: Two times a day (BID) | ORAL | 1 refills | Status: DC
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
## 2020-04-14 NOTE — Assessment & Plan Note (Signed)
Continue with current regimen of venlafaxine and she takes a total of 75 mg daily.  Did refill just a short-term prescription of the clonazepam to use sparingly.

## 2020-04-19 ENCOUNTER — Encounter: Payer: Self-pay | Admitting: Family Medicine

## 2020-04-19 NOTE — Assessment & Plan Note (Signed)
Did refill her Zofran which she uses as needed for her migraines.  She did not request a refill on Imitrex today.

## 2020-09-05 ENCOUNTER — Other Ambulatory Visit: Payer: Self-pay | Admitting: Family Medicine

## 2020-09-05 DIAGNOSIS — F4323 Adjustment disorder with mixed anxiety and depressed mood: Secondary | ICD-10-CM

## 2020-09-06 MED ORDER — CLONAZEPAM 0.5 MG PO TABS: ORAL_TABLET | ORAL | 0 refills | Status: DC

## 2020-10-13 ENCOUNTER — Ambulatory Visit: Payer: Managed Care, Other (non HMO) | Admitting: Family Medicine

## 2020-12-05 ENCOUNTER — Telehealth (INDEPENDENT_AMBULATORY_CARE_PROVIDER_SITE_OTHER): Payer: Managed Care, Other (non HMO) | Admitting: Family Medicine

## 2020-12-05 ENCOUNTER — Encounter: Payer: Self-pay | Admitting: Family Medicine

## 2020-12-05 VITALS — Ht 62.0 in

## 2020-12-05 DIAGNOSIS — I493 Ventricular premature depolarization: Secondary | ICD-10-CM

## 2020-12-05 DIAGNOSIS — F4323 Adjustment disorder with mixed anxiety and depressed mood: Secondary | ICD-10-CM | POA: Diagnosis not present

## 2020-12-05 MED ORDER — FLUOXETINE HCL 10 MG PO CAPS: ORAL_CAPSULE | ORAL | 0 refills | Status: DC

## 2020-12-05 MED ORDER — CLONAZEPAM 0.5 MG PO TABS: ORAL_TABLET | ORAL | 0 refills | Status: DC

## 2020-12-05 NOTE — Progress Notes (Signed)
Pt stated that she doesn't feel that the medication is not as effective and she is struggling more. She feels that she has become more tolerant to the medication. She has noticed that her PVC's have been more lately and she   She has had to take time off work due to how she has been feeling lately.

## 2020-12-05 NOTE — Assessment & Plan Note (Addendum)
Discussed can consider trial of betablocker.  She will think about it.  She feels like there are definitely anxiety triggered and is wanting a refill on her clonazepam just reminded her to use it very sparingly.

## 2020-12-05 NOTE — Progress Notes (Signed)
Virtual Visit via Video Note  I connected with Shirley Ruiz on 12/05/20 at  3:00 PM EDT by a video enabled telemedicine application and verified that I am speaking with the correct person using two identifiers.   I discussed the limitations of evaluation and management by telemedicine and the availability of in person appointments. The patient expressed understanding and agreed to proceed.  Patient location: in her care Provider location: in office    Established Patient Office Visit  Subjective:  Patient ID: Shirley Ruiz, female    DOB: Feb 05, 1986  Age: 35 y.o. MRN: [id number]  CC:  Chief Complaint  Patient presents with  . Follow-up    HPI Shirley Ruiz presents for Migraines and Mood  Pt stated that she doesn't feel that the medication is not as effective and she is struggling more. She feels that she has become more tolerant to the medication. She has noticed that her PVC's have been more lately and she feeling more tearful. Has been picking on skin. She has had to take time off work due to how she has been feeling lately. Current early on Effexor. Finds herself spacing out and not feeling motivated.  She just does not feel that the medication is as effective as it was at 1 time.  She has had a lot of financial stressors in particular she really loves her job and she is been there for 9 years but really just does not make enough money at her job to really support her and her daughter so she is having to live with her sister she really like to be able to get her own place.  She reports she has been experiencing more palpitations than usual.  She has a history of PVCs diagnosed back in 2014.  She has never been on a beta-blocker for it. 4.   Past Medical History:  Diagnosis Date  . Abnormal Pap smear 6/10   .HGSIL, Colpo and Cryo  . Anxiety   . Asthma   . Depression   . Diabetes in pregnancy    Gestational  . Intervertebral disc protrusion   . Mental disorder    H/O  depression  . Migraines   . PVC (premature ventricular contraction)     Past Surgical History:  Procedure Laterality Date  . CRYOTHERAPY     For cervical dysplasia  . MOUTH SURGERY     Cut in mouth to remove an extra tooth    Family History  Problem Relation Age of Onset  . Depression Mother   . Hypertension Mother   . Heart disease Mother   . Depression Maternal Grandmother   . Heart attack Father   . Heart disease Maternal Grandfather     Social History   Socioeconomic History  . Marital status: Divorced    Spouse name: Not on file  . Number of children: 1  . Years of education: Not on file  . Highest education level: Not on file  Occupational History  . Not on file  Tobacco Use  . Smoking status: Never Smoker  . Smokeless tobacco: Never Used  Vaping Use  . Vaping Use: Never used  Substance and Sexual Activity  . Alcohol use: Yes    Comment: occasionally as a social drinker  . Drug use: No  . Sexual activity: Yes    Partners: Male    Birth control/protection: I.U.D.  Other Topics Concern  . Not on file  Social History Narrative   Regular exercise.  Works at  [employer]. She is back in school to get a college degree.  Raising her daughter by herself.     Social Determinants of Health   Financial Resource Strain: Not on file  Food Insecurity: Not on file  Transportation Needs: Not on file  Physical Activity: Not on file  Stress: Not on file  Social Connections: Not on file  Intimate Partner Violence: Not on file    Outpatient Medications Prior to Visit  Medication Sig Dispense Refill  . Esomeprazole Magnesium (NEXIUM PO) Take by mouth.    . Fluticasone-Salmeterol (ADVAIR DISKUS) 250-50 MCG/DOSE AEPB Inhale 1 puff into the lungs 2 (two) times daily. 1 each 1  . montelukast (SINGULAIR) 10 MG tablet Take 1 tablet (10 mg total) by mouth at bedtime. 30 tablet 3  . ondansetron (ZOFRAN) 8 MG tablet Take 1 tablet (8 mg total) by mouth every 8 (eight) hours  as needed. for nausea 12 tablet 1  . phentermine (ADIPEX-P) 37.5 MG tablet Take 37.5 mg by mouth 2 (two) times daily.    . SUMAtriptan (IMITREX) 100 MG tablet TAKE ONE TABLET BY MOUTH EVERY 2 HOURS AS NEEDED FOR MIGRAINE 10 tablet 5  . tretinoin (RETIN-A) 0.025 % gel Apply topically at bedtime. 45 g 1  . clonazePAM (KLONOPIN) 0.5 MG tablet TAKE 1 TABLET BY MOUTH ONCE DAILY AS NEEDED FOR ANXIETY 20 tablet 0  . venlafaxine XR (EFFEXOR-XR) 37.5 MG 24 hr capsule Take 1 capsule (37.5 mg total) by mouth 2 (two) times daily. 180 capsule 1  . fluconazole (DIFLUCAN) 150 MG tablet Take 1 tablet (150 mg total) by mouth every 3 (three) days. For three doses 3 tablet 3   No facility-administered medications prior to visit.    Allergies  Allergen Reactions  . Bupropion Other (See Comments) and Rash    Increased PVCs Increased PVCs Increased PVCs Increased PVCs  . Penicillins Hives    ROS Review of Systems    Objective:    Physical Exam Constitutional:      Appearance: She is well-developed.  HENT:     Head: Normocephalic and atraumatic.  Cardiovascular:     Rate and Rhythm: Normal rate and regular rhythm.     Heart sounds: Normal heart sounds.  Pulmonary:     Effort: Pulmonary effort is normal.     Breath sounds: Normal breath sounds.  Skin:    General: Skin is warm and dry.  Neurological:     Mental Status: She is alert and oriented to person, place, and time.  Psychiatric:        Behavior: Behavior normal.     Ht 5\' 2"  (1.575 m)   BMI 24.51 kg/m  Wt Readings from Last 3 Encounters:  04/14/20 134 lb (60.8 kg)  10/11/19 141 lb (64 kg)  10/11/19 139 lb (63 kg)     Health Maintenance Due  Topic Date Due  . COVID-19 Vaccine (2 - Pfizer 3-dose series) 06/25/2020    There are no preventive care reminders to display for this patient.  Lab Results  Component Value Date   TSH 2.52 10/11/2019   Lab Results  Component Value Date   WBC 5.4 10/11/2019   HGB 14.3  10/11/2019   HCT 42.9 10/11/2019   MCV 90.1 10/11/2019   PLT 285 10/11/2019   Lab Results  Component Value Date   NA 142 10/11/2019   K 4.6 10/11/2019   CO2 27 10/11/2019   GLUCOSE 89 10/11/2019   BUN 11 10/11/2019  CREATININE 0.72 10/11/2019   BILITOT 0.4 10/11/2019   ALKPHOS 43 05/23/2016   AST 22 10/11/2019   ALT 18 10/11/2019   PROT 7.4 10/11/2019   ALBUMIN 4.5 05/23/2016   CALCIUM 9.5 10/11/2019   Lab Results  Component Value Date   CHOL 153 10/11/2019   Lab Results  Component Value Date   HDL 67 10/11/2019   Lab Results  Component Value Date   LDLCALC 74 10/11/2019   Lab Results  Component Value Date   TRIG 50 10/11/2019   Lab Results  Component Value Date   CHOLHDL 2.3 10/11/2019   No results found for: HGBA1C    Assessment & Plan:   Problem List Items Addressed This Visit      Cardiovascular and Mediastinum   PVC (premature ventricular contraction)    Discussed can consider trial of betablocker.  She will think about it.  She feels like there are definitely anxiety triggered and is wanting a refill on her clonazepam just reminded her to use it very sparingly.        Other   Adjustment disorder with mixed anxiety and depressed mood - Primary    We discussed options.  Working to go ahead and taper off the venlafaxine.  Though we did discuss the potential option of increasing her dose but she felt more comfortable going back to fluoxetine which she had taken for years until she felt like it was becoming ineffective.  Taper given and new prescription sent to pharmacy.  Also refilled her clonazepam to use sparingly plan is to follow-up in about 2 to 3 months.      Relevant Medications   clonazePAM (KLONOPIN) 0.5 MG tablet      Meds ordered this encounter  Medications  . FLUoxetine (PROZAC) 10 MG capsule    Sig: Take 1 capsule (10 mg total) by mouth daily for 6 days, THEN 2 capsules (20 mg total) daily for 24 days.    Dispense:  54 capsule     Refill:  0  . clonazePAM (KLONOPIN) 0.5 MG tablet    Sig: TAKE 1 TABLET BY MOUTH ONCE DAILY AS NEEDED FOR ANXIETY    Dispense:  20 tablet    Refill:  0    Follow-up: No follow-ups on file.     Time spent in encounter 21 minutes  I discussed the assessment and treatment plan with the patient. The patient was provided an opportunity to ask questions and all were answered. The patient agreed with the plan and demonstrated an understanding of the instructions.   The patient was advised to call back or seek an in-person evaluation if the symptoms worsen or if the condition fails to improve as anticipated.   Nani Gasser, MD

## 2020-12-05 NOTE — Assessment & Plan Note (Signed)
We discussed options.  Working to go ahead and taper off the venlafaxine.  Though we did discuss the potential option of increasing her dose but she felt more comfortable going back to fluoxetine which she had taken for years until she felt like it was becoming ineffective.  Taper given and new prescription sent to pharmacy.  Also refilled her clonazepam to use sparingly plan is to follow-up in about 2 to 3 months.

## 2021-01-12 ENCOUNTER — Other Ambulatory Visit: Payer: Self-pay | Admitting: Family Medicine

## 2021-01-23 ENCOUNTER — Encounter (INDEPENDENT_AMBULATORY_CARE_PROVIDER_SITE_OTHER): Payer: Managed Care, Other (non HMO) | Admitting: Family Medicine

## 2021-01-23 DIAGNOSIS — R7612 Nonspecific reaction to cell mediated immunity measurement of gamma interferon antigen response without active tuberculosis: Secondary | ICD-10-CM

## 2021-01-23 DIAGNOSIS — Z227 Latent tuberculosis: Secondary | ICD-10-CM | POA: Insufficient documentation

## 2021-01-23 NOTE — Telephone Encounter (Signed)
I spent 5 total minutes of online digital evaluation and management services. 

## 2021-01-23 NOTE — Assessment & Plan Note (Addendum)
Positive QuantiFERON gold, negative chest x-ray, adding rifampin daily for 4 months. It sounds like she would like to do an additional test which is reasonable, I have recommended a second QuantiFERON gold rather than PPD.  She will let me know.

## 2021-01-24 ENCOUNTER — Other Ambulatory Visit: Payer: Self-pay

## 2021-01-24 ENCOUNTER — Ambulatory Visit (INDEPENDENT_AMBULATORY_CARE_PROVIDER_SITE_OTHER): Payer: Managed Care, Other (non HMO)

## 2021-01-24 DIAGNOSIS — R7612 Nonspecific reaction to cell mediated immunity measurement of gamma interferon antigen response without active tuberculosis: Secondary | ICD-10-CM

## 2021-01-25 MED ORDER — RIFAMPIN 300 MG PO CAPS: 300.0000 mg | ORAL_CAPSULE | Freq: Two times a day (BID) | ORAL | 0 refills | Status: DC

## 2021-01-25 NOTE — Telephone Encounter (Signed)
Lets get her in for a PPD

## 2021-01-25 NOTE — Addendum Note (Signed)
Addended by: Monica Becton on: 01/25/2021 09:49 AM   Modules accepted: Orders

## 2021-01-25 NOTE — Telephone Encounter (Signed)
Left message for patient to call back to get appt scheduled. Okay'd per triage to schedule NV for the 2pm slot tomorrow (only one open in the afternoon) & then again early on Monday for the read. AM

## 2021-01-27 ENCOUNTER — Encounter: Payer: Self-pay | Admitting: Family Medicine

## 2021-01-29 ENCOUNTER — Ambulatory Visit (INDEPENDENT_AMBULATORY_CARE_PROVIDER_SITE_OTHER): Payer: Self-pay | Admitting: Family Medicine

## 2021-01-29 ENCOUNTER — Other Ambulatory Visit: Payer: Self-pay

## 2021-01-29 VITALS — BP 154/91 | HR 76

## 2021-01-29 DIAGNOSIS — Z111 Encounter for screening for respiratory tuberculosis: Secondary | ICD-10-CM

## 2021-01-29 NOTE — Progress Notes (Signed)
Blood pressure unusually elevated today.  We will recheck again when she comes back for PPD read.  She did start a new job and is feeling stressed.

## 2021-01-29 NOTE — Telephone Encounter (Signed)
Patient advised at appointment.

## 2021-01-29 NOTE — Progress Notes (Signed)
Established Patient Office Visit  Subjective:  Patient ID: Shirley Ruiz, female    DOB: [birth year]  Age: 35 y.o. MRN: [id number]  CC:  Chief Complaint  Patient presents with   PPD Placement    HPI Shirley Ruiz presents for PPD placement.   Past Medical History:  Diagnosis Date   Abnormal Pap smear 6/10   .HGSIL, Colpo and Cryo   Anxiety    Asthma    Depression    Diabetes in pregnancy    Gestational   Intervertebral disc protrusion    Mental disorder    H/O depression   Migraines    PVC (premature ventricular contraction)     Past Surgical History:  Procedure Laterality Date   CRYOTHERAPY     For cervical dysplasia   MOUTH SURGERY     Cut in mouth to remove an extra tooth    Family History  Problem Relation Age of Onset   Depression Mother    Hypertension Mother    Heart disease Mother    Depression Maternal Grandmother    Heart attack Father    Heart disease Maternal Grandfather     Social History   Socioeconomic History   Marital status: Divorced    Spouse name: Not on file   Number of children: 1   Years of education: Not on file   Highest education level: Not on file  Occupational History   Not on file  Tobacco Use   Smoking status: Never   Smokeless tobacco: Never  Vaping Use   Vaping Use: Never used  Substance and Sexual Activity   Alcohol use: Yes    Comment: occasionally as a social drinker   Drug use: No   Sexual activity: Yes    Partners: Male    Birth control/protection: I.U.D.  Other Topics Concern   Not on file  Social History Narrative   Regular exercise.  Works at [employer]. She is back in school to get a college degree.  Raising her daughter by herself.     Social Determinants of Health   Financial Resource Strain: Not on file  Food Insecurity: Not on file  Transportation Needs: Not on file  Physical Activity: Not on file  Stress: Not on file  Social Connections: Not on file  Intimate Partner Violence: Not  on file    Outpatient Medications Prior to Visit  Medication Sig Dispense Refill   clonazePAM (KLONOPIN) 0.5 MG tablet TAKE 1 TABLET BY MOUTH ONCE DAILY AS NEEDED FOR ANXIETY 20 tablet 0   Esomeprazole Magnesium (NEXIUM PO) Take by mouth.     FLUoxetine (PROZAC) 10 MG capsule Take 2 capsules (20 mg total) by mouth daily. 60 capsule 1   Fluticasone-Salmeterol (ADVAIR DISKUS) 250-50 MCG/DOSE AEPB Inhale 1 puff into the lungs 2 (two) times daily. 1 each 1   montelukast (SINGULAIR) 10 MG tablet Take 1 tablet (10 mg total) by mouth at bedtime. 30 tablet 3   ondansetron (ZOFRAN) 8 MG tablet Take 1 tablet (8 mg total) by mouth every 8 (eight) hours as needed. for nausea 12 tablet 1   phentermine (ADIPEX-P) 37.5 MG tablet Take 37.5 mg by mouth 2 (two) times daily.     rifampin (RIFADIN) 300 MG capsule Take 1 capsule (300 mg total) by mouth 2 (two) times daily. 240 capsule 0   SUMAtriptan (IMITREX) 100 MG tablet TAKE ONE TABLET BY MOUTH EVERY 2 HOURS AS NEEDED FOR MIGRAINE 10 tablet 5   tretinoin (RETIN-A) 0.025 %  gel Apply topically at bedtime. 45 g 1   No facility-administered medications prior to visit.    Allergies  Allergen Reactions   Bupropion Other (See Comments) and Rash    Increased PVCs Increased PVCs Increased PVCs Increased PVCs   Penicillins Hives    ROS Review of Systems    Objective:    Physical Exam  BP (!) 154/96   Pulse 76   SpO2 100%  Wt Readings from Last 3 Encounters:  04/14/20 134 lb (60.8 kg)  10/11/19 141 lb (64 kg)  10/11/19 139 lb (63 kg)     Health Maintenance Due  Topic Date Due   COVID-19 Vaccine (2 - Pfizer series) 06/25/2020    There are no preventive care reminders to display for this patient.  Lab Results  Component Value Date   TSH 2.52 10/11/2019   Lab Results  Component Value Date   WBC 5.4 10/11/2019   HGB 14.3 10/11/2019   HCT 42.9 10/11/2019   MCV 90.1 10/11/2019   PLT 285 10/11/2019   Lab Results  Component Value Date    NA 142 10/11/2019   K 4.6 10/11/2019   CO2 27 10/11/2019   GLUCOSE 89 10/11/2019   BUN 11 10/11/2019   CREATININE 0.72 10/11/2019   BILITOT 0.4 10/11/2019   ALKPHOS 43 05/23/2016   AST 22 10/11/2019   ALT 18 10/11/2019   PROT 7.4 10/11/2019   ALBUMIN 4.5 05/23/2016   CALCIUM 9.5 10/11/2019   Lab Results  Component Value Date   CHOL 153 10/11/2019   Lab Results  Component Value Date   HDL 67 10/11/2019   Lab Results  Component Value Date   LDLCALC 74 10/11/2019   Lab Results  Component Value Date   TRIG 50 10/11/2019   Lab Results  Component Value Date   CHOLHDL 2.3 10/11/2019   No results found for: HGBA1C    Assessment & Plan:  TB Screening - Patient tolerated injection well without complications. Blood pressure was elevated today. We plan on rechecking in 2 days. Patient advised to schedule next injection 2 days from today.    Problem List Items Addressed This Visit   None Visit Diagnoses     Screening-pulmonary TB    -  Primary   Relevant Orders   PPD       No orders of the defined types were placed in this encounter.   Follow-up: Return in about 2 days (around 01/31/2021) for PPD reading. Earna Coder, Janalyn Harder, CMA

## 2021-01-31 ENCOUNTER — Other Ambulatory Visit: Payer: Self-pay

## 2021-01-31 ENCOUNTER — Ambulatory Visit (INDEPENDENT_AMBULATORY_CARE_PROVIDER_SITE_OTHER): Payer: BC Managed Care – PPO | Admitting: Sports Medicine

## 2021-01-31 VITALS — BP 155/91 | HR 72 | Resp 20 | Ht 62.0 in | Wt 134.0 lb

## 2021-01-31 DIAGNOSIS — Z111 Encounter for screening for respiratory tuberculosis: Secondary | ICD-10-CM | POA: Diagnosis not present

## 2021-01-31 DIAGNOSIS — Z227 Latent tuberculosis: Secondary | ICD-10-CM

## 2021-01-31 LAB — TB SKIN TEST
Induration: 0 mm
TB Skin Test: NEGATIVE

## 2021-01-31 NOTE — Assessment & Plan Note (Addendum)
Positive QuantiFERON gold, negative chest x-ray, patient requested tuberculin skin test as well which has come back negative today. On the up-to-date flowsheet the recommendation with a negative tuberculin skin test and a positive QuantiFERON gold and a negative chest x-ray is to treat for latent tuberculosis with rifampin daily for 4 months. Awaiting patient decision.  Update: Patient agrees, starting rifampin, further management per primary care provider.

## 2021-01-31 NOTE — Progress Notes (Signed)
Established Patient Office Visit  Subjective:  Patient ID: Shirley Ruiz, female    DOB: [birth year]  Age: 35 y.o. MRN: [id number]  CC:  Chief Complaint  Patient presents with   PPD Reading    HPI Shirley Ruiz presents for a PPD read. TB test is negative. BP elevated today.   Past Medical History:  Diagnosis Date   Abnormal Pap smear 6/10   .HGSIL, Colpo and Cryo   Anxiety    Asthma    Depression    Diabetes in pregnancy    Gestational   Intervertebral disc protrusion    Mental disorder    H/O depression   Migraines    PVC (premature ventricular contraction)     Past Surgical History:  Procedure Laterality Date   CRYOTHERAPY     For cervical dysplasia   MOUTH SURGERY     Cut in mouth to remove an extra tooth    Family History  Problem Relation Age of Onset   Depression Mother    Hypertension Mother    Heart disease Mother    Depression Maternal Grandmother    Heart attack Father    Heart disease Maternal Grandfather     Social History   Socioeconomic History   Marital status: Divorced    Spouse name: Not on file   Number of children: 1   Years of education: Not on file   Highest education level: Not on file  Occupational History   Not on file  Tobacco Use   Smoking status: Never   Smokeless tobacco: Never  Vaping Use   Vaping Use: Never used  Substance and Sexual Activity   Alcohol use: Yes    Comment: occasionally as a social drinker   Drug use: No   Sexual activity: Yes    Partners: Male    Birth control/protection: I.U.D.  Other Topics Concern   Not on file  Social History Narrative   Regular exercise.  Works at [employer]. She is back in school to get a college degree.  Raising her daughter by herself.     Social Determinants of Health   Financial Resource Strain: Not on file  Food Insecurity: Not on file  Transportation Needs: Not on file  Physical Activity: Not on file  Stress: Not on file  Social Connections: Not on file   Intimate Partner Violence: Not on file    Outpatient Medications Prior to Visit  Medication Sig Dispense Refill   clonazePAM (KLONOPIN) 0.5 MG tablet TAKE 1 TABLET BY MOUTH ONCE DAILY AS NEEDED FOR ANXIETY 20 tablet 0   Esomeprazole Magnesium (NEXIUM PO) Take by mouth.     FLUoxetine (PROZAC) 10 MG capsule Take 2 capsules (20 mg total) by mouth daily. 60 capsule 1   Fluticasone-Salmeterol (ADVAIR DISKUS) 250-50 MCG/DOSE AEPB Inhale 1 puff into the lungs 2 (two) times daily. 1 each 1   montelukast (SINGULAIR) 10 MG tablet Take 1 tablet (10 mg total) by mouth at bedtime. 30 tablet 3   ondansetron (ZOFRAN) 8 MG tablet Take 1 tablet (8 mg total) by mouth every 8 (eight) hours as needed. for nausea 12 tablet 1   phentermine (ADIPEX-P) 37.5 MG tablet Take 37.5 mg by mouth 2 (two) times daily.     rifampin (RIFADIN) 300 MG capsule Take 1 capsule (300 mg total) by mouth 2 (two) times daily. 240 capsule 0   SUMAtriptan (IMITREX) 100 MG tablet TAKE ONE TABLET BY MOUTH EVERY 2 HOURS AS NEEDED FOR MIGRAINE 10  tablet 5   tretinoin (RETIN-A) 0.025 % gel Apply topically at bedtime. 45 g 1   No facility-administered medications prior to visit.    Allergies  Allergen Reactions   Bupropion Other (See Comments) and Rash    Increased PVCs Increased PVCs Increased PVCs Increased PVCs   Penicillins Hives    ROS Review of Systems    Objective:    Physical Exam  BP (!) 158/99 (BP Location: Left Arm, Patient Position: Sitting, Cuff Size: Normal)   Pulse 72   Resp 20   Ht 5\' 2"  (1.575 m)   Wt 134 lb (60.8 kg)   SpO2 100%   BMI 24.51 kg/m  Wt Readings from Last 3 Encounters:  01/31/21 134 lb (60.8 kg)  04/14/20 134 lb (60.8 kg)  10/11/19 141 lb (64 kg)     Health Maintenance Due  Topic Date Due   COVID-19 Vaccine (2 - Pfizer series) 06/25/2020    There are no preventive care reminders to display for this patient.  Lab Results  Component Value Date   TSH 2.52 10/11/2019   Lab  Results  Component Value Date   WBC 5.4 10/11/2019   HGB 14.3 10/11/2019   HCT 42.9 10/11/2019   MCV 90.1 10/11/2019   PLT 285 10/11/2019   Lab Results  Component Value Date   NA 142 10/11/2019   K 4.6 10/11/2019   CO2 27 10/11/2019   GLUCOSE 89 10/11/2019   BUN 11 10/11/2019   CREATININE 0.72 10/11/2019   BILITOT 0.4 10/11/2019   ALKPHOS 43 05/23/2016   AST 22 10/11/2019   ALT 18 10/11/2019   PROT 7.4 10/11/2019   ALBUMIN 4.5 05/23/2016   CALCIUM 9.5 10/11/2019   Lab Results  Component Value Date   CHOL 153 10/11/2019   Lab Results  Component Value Date   HDL 67 10/11/2019   Lab Results  Component Value Date   LDLCALC 74 10/11/2019   Lab Results  Component Value Date   TRIG 50 10/11/2019   Lab Results  Component Value Date   CHOLHDL 2.3 10/11/2019   No results found for: HGBA1C    Assessment & Plan:  TB test is negative. Problem List Items Addressed This Visit   None Visit Diagnoses     Screening-pulmonary TB    -  Primary       No orders of the defined types were placed in this encounter.   Follow-up: No follow-ups on file.    12/11/2019, CMA

## 2021-01-31 NOTE — Progress Notes (Signed)
I sent it in back on the 23rd.  Rifampin 300 twice daily for 4 months.  [employer] [facility] [employer].

## 2021-01-31 NOTE — Progress Notes (Signed)
Pt's PPD test is negative. Pt also had clear chest x-ray. However, pt had positive QuantiFERON gold.

## 2021-01-31 NOTE — Progress Notes (Signed)
Pt advised. Pt is agreeable to treatment and will pick up medication today.

## 2021-02-06 ENCOUNTER — Other Ambulatory Visit: Payer: Self-pay | Admitting: Family Medicine

## 2021-02-06 DIAGNOSIS — G43009 Migraine without aura, not intractable, without status migrainosus: Secondary | ICD-10-CM

## 2021-03-28 ENCOUNTER — Other Ambulatory Visit: Payer: Self-pay | Admitting: Family Medicine

## 2021-03-28 DIAGNOSIS — F4323 Adjustment disorder with mixed anxiety and depressed mood: Secondary | ICD-10-CM

## 2021-04-23 ENCOUNTER — Encounter: Payer: Self-pay | Admitting: Family Medicine

## 2021-04-23 MED ORDER — FLUOXETINE HCL 10 MG PO CAPS: 30.0000 mg | ORAL_CAPSULE | Freq: Every day | ORAL | 1 refills | Status: DC

## 2021-04-23 NOTE — Telephone Encounter (Signed)
Rx sent. Have her schedule f/u in 6-8 weeks.   Meds ordered this encounter  Medications   FLUoxetine (PROZAC) 10 MG capsule    Sig: Take 3 capsules (30 mg total) by mouth daily.    Dispense:  90 capsule    Refill:  1

## 2021-06-24 ENCOUNTER — Other Ambulatory Visit: Payer: Self-pay | Admitting: Family Medicine

## 2021-06-24 DIAGNOSIS — G43009 Migraine without aura, not intractable, without status migrainosus: Secondary | ICD-10-CM

## 2021-07-24 ENCOUNTER — Other Ambulatory Visit: Payer: Self-pay | Admitting: Family Medicine

## 2021-07-25 NOTE — Telephone Encounter (Signed)
Please call pt. She was supposed to f/u 6-8 wks after her dosage was adjusted on  04/23/2021. Will send in 14 day supply for now.

## 2021-07-25 NOTE — Telephone Encounter (Signed)
LVM for patient to call back to get f/u with Dr Linford Arnold scheduled. AM

## 2021-08-12 ENCOUNTER — Encounter: Payer: Self-pay | Admitting: Family Medicine

## 2021-08-12 ENCOUNTER — Other Ambulatory Visit: Payer: Self-pay | Admitting: Family Medicine

## 2021-08-12 DIAGNOSIS — G43009 Migraine without aura, not intractable, without status migrainosus: Secondary | ICD-10-CM

## 2021-08-14 MED ORDER — ONDANSETRON HCL 8 MG PO TABS: 8.0000 mg | ORAL_TABLET | Freq: Three times a day (TID) | ORAL | 0 refills | Status: DC | PRN

## 2021-08-14 MED ORDER — FLUOXETINE HCL 10 MG PO CAPS: 30.0000 mg | ORAL_CAPSULE | Freq: Every day | ORAL | 0 refills | Status: DC

## 2021-08-14 MED ORDER — SUMATRIPTAN SUCCINATE 100 MG PO TABS: ORAL_TABLET | ORAL | 0 refills | Status: DC

## 2021-10-11 ENCOUNTER — Encounter: Payer: Self-pay | Admitting: Family Medicine

## 2021-10-30 ENCOUNTER — Ambulatory Visit (INDEPENDENT_AMBULATORY_CARE_PROVIDER_SITE_OTHER): Payer: BC Managed Care – PPO | Admitting: Family Medicine

## 2021-10-30 ENCOUNTER — Other Ambulatory Visit: Payer: Self-pay

## 2021-10-30 ENCOUNTER — Encounter: Payer: Self-pay | Admitting: Family Medicine

## 2021-10-30 VITALS — BP 127/72 | HR 58 | Resp 16 | Ht 62.0 in | Wt 169.0 lb

## 2021-10-30 DIAGNOSIS — R768 Other specified abnormal immunological findings in serum: Secondary | ICD-10-CM

## 2021-10-30 DIAGNOSIS — F4323 Adjustment disorder with mixed anxiety and depressed mood: Secondary | ICD-10-CM | POA: Diagnosis not present

## 2021-10-30 DIAGNOSIS — Z23 Encounter for immunization: Secondary | ICD-10-CM

## 2021-10-30 DIAGNOSIS — J4520 Mild intermittent asthma, uncomplicated: Secondary | ICD-10-CM

## 2021-10-30 DIAGNOSIS — B009 Herpesviral infection, unspecified: Secondary | ICD-10-CM

## 2021-10-30 DIAGNOSIS — Z0000 Encounter for general adult medical examination without abnormal findings: Secondary | ICD-10-CM

## 2021-10-30 DIAGNOSIS — R7689 Other specified abnormal immunological findings in serum: Secondary | ICD-10-CM | POA: Insufficient documentation

## 2021-10-30 DIAGNOSIS — F410 Panic disorder [episodic paroxysmal anxiety] without agoraphobia: Secondary | ICD-10-CM | POA: Diagnosis not present

## 2021-10-30 MED ORDER — VALACYCLOVIR HCL 500 MG PO TABS: 500.0000 mg | ORAL_TABLET | Freq: Every day | ORAL | 3 refills | Status: DC

## 2021-10-30 MED ORDER — ALBUTEROL SULFATE (2.5 MG/3ML) 0.083% IN NEBU
2.5000 mg | INHALATION_SOLUTION | Freq: Four times a day (QID) | RESPIRATORY_TRACT | 1 refills | Status: DC | PRN
Start: 2021-10-30 — End: 2022-03-13

## 2021-10-30 MED ORDER — ALBUTEROL SULFATE HFA 108 (90 BASE) MCG/ACT IN AERS: 2.0000 | INHALATION_SPRAY | Freq: Four times a day (QID) | RESPIRATORY_TRACT | 1 refills | Status: AC | PRN

## 2021-10-30 MED ORDER — FLUOXETINE HCL 10 MG PO CAPS: 30.0000 mg | ORAL_CAPSULE | Freq: Every day | ORAL | 3 refills | Status: DC

## 2021-10-30 MED ORDER — CLONAZEPAM 0.5 MG PO TABS: 0.5000 mg | ORAL_TABLET | Freq: Every day | ORAL | 0 refills | Status: DC | PRN

## 2021-10-30 NOTE — Progress Notes (Addendum)
Subjective:  ?  ? Shirley Ruiz is a 36 y.o. female and is here for a comprehensive physical exam. The patient reports no problems. She is doing well. Work is going well and less stressful than her previous job.  She would like to get her pneumonia vaccine and Tdap updated today. ? ?She has had to use her albuterol nebulizer but realized that her albuterol vials were old and would like a new prescription. ? ?Social History  ? ?Socioeconomic History  ? Marital status: Divorced  ?  Spouse name: Not on file  ? Number of children: 1  ? Years of education: Not on file  ? Highest education level: Not on file  ?Occupational History  ? Occupation: Nurse, mental health  ?Tobacco Use  ? Smoking status: Never  ? Smokeless tobacco: Never  ?Vaping Use  ? Vaping Use: Never used  ?Substance and Sexual Activity  ? Alcohol use: Yes  ?  Alcohol/week: 1.0 standard drink  ?  Types: 1 Shots of liquor per week  ?  Comment: occasionally as a social drinker liquor  ? Drug use: No  ? Sexual activity: Yes  ?  Partners: Male  ?  Birth control/protection: I.U.D.  ?Other Topics Concern  ? Not on file  ?Social History Narrative  ? Regular exercise 5-6 days week of Cardio and Strength training.  Works at [employer].   Raising her daughter by herself.    ? ?Social Determinants of Health  ? ?Financial Resource Strain: Not on file  ?Food Insecurity: Not on file  ?Transportation Needs: Not on file  ?Physical Activity: Not on file  ?Stress: Not on file  ?Social Connections: Not on file  ?Intimate Partner Violence: Not on file  ? ?Health Maintenance  ?Topic Date Due  ? INFLUENZA VACCINE  11/02/2021 (Originally 03/05/2021)  ? COVID-19 Vaccine (2 - Pfizer series) 11/15/2021 (Originally 06/26/2020)  ? PAP SMEAR-Modifier  10/11/2022  ? TETANUS/TDAP  10/31/2031  ? Hepatitis C Screening  Completed  ? HIV Screening  Completed  ? Pneumococcal Vaccine 67-56 Years old  Aged Out  ? HPV VACCINES  Aged Out  ? ? ?The following portions of the patient's history were  reviewed and updated as appropriate: allergies, current medications, past family history, past medical history, past social history, past surgical history, and problem list. ? ?Review of Systems ?Pertinent items are noted in HPI.  ? ?Objective:  ? ? BP 127/72   Pulse (!) 58   Resp 16   Ht 5\' 2"  (1.575 m)   Wt 169 lb (76.7 kg)   BMI 30.91 kg/m?  ?General appearance: alert, cooperative, and appears stated age ?Head: Normocephalic, without obvious abnormality, atraumatic ?Eyes:  conj clear, EOMI, PEERLA ?Ears: normal TM's and external ear canals both ears ?Nose: Nares normal. Septum midline. Mucosa normal. No drainage or sinus tenderness. ?Throat: lips, mucosa, and tongue normal; teeth and gums normal ?Neck: no adenopathy, no carotid bruit, no JVD, supple, symmetrical, trachea midline, and thyroid not enlarged, symmetric, no tenderness/mass/nodules ?Back: symmetric, no curvature. ROM normal. No CVA tenderness. ?Lungs: clear to auscultation bilaterally ?Breasts: normal appearance, no masses or tenderness ?Heart: regular rate and rhythm, S1, S2 normal, no murmur, click, rub or gallop ?Abdomen: soft, non-tender; bowel sounds normal; no masses,  no organomegaly ?Extremities: extremities normal, atraumatic, no cyanosis or edema ?Pulses: 2+ and symmetric ?Skin: Skin color, texture, turgor normal. No rashes or lesions ?Lymph nodes: Cervical, supraclavicular, and axillary nodes normal. ?Neurologic: Grossly normal  ?  ?Assessment:  ? ?  Healthy female exam.    ?  ?Plan:  ? ?  ?See After Visit Summary for Counseling Recommendations  ?complete physical examination ?Keep up a regular exercise program and make sure you are eating a healthy diet ?Try to eat 4 servings of dairy a day, or if you are lactose intolerant take a calcium with vitamin D daily.  ?Your vaccines are up to date.  ? ?Would ike a rx for valtrex. Tested pos for HSV1 and HSV2, denies any active outbreaks.  ? ?Meds ordered this encounter  ?Medications  ?  FLUoxetine (PROZAC) 10 MG capsule  ?  Sig: Take 3 capsules (30 mg total) by mouth daily. 14 DAY SUPPLY GIVEN. MUST SCHEDULE AND KEEP APPOINTMENT FOR REFILLS  ?  Dispense:  270 capsule  ?  Refill:  3  ? clonazePAM (KLONOPIN) 0.5 MG tablet  ?  Sig: Take 1 tablet (0.5 mg total) by mouth daily as needed for anxiety.  ?  Dispense:  20 tablet  ?  Refill:  0  ? valACYclovir (VALTREX) 500 MG tablet  ?  Sig: Take 1 tablet (500 mg total) by mouth daily.  ?  Dispense:  90 tablet  ?  Refill:  3  ? albuterol (VENTOLIN HFA) 108 (90 Base) MCG/ACT inhaler  ?  Sig: Inhale 2 puffs into the lungs every 6 (six) hours as needed for wheezing or shortness of breath.  ?  Dispense:  18 g  ?  Refill:  1  ? albuterol (PROVENTIL) (2.5 MG/3ML) 0.083% nebulizer solution  ?  Sig: Take 3 mLs (2.5 mg total) by nebulization every 6 (six) hours as needed for wheezing or shortness of breath.  ?  Dispense:  60 mL  ?  Refill:  1  ? ? ? ? ?

## 2021-10-31 LAB — COMPLETE METABOLIC PANEL WITH GFR
AG Ratio: 1.5 (calc) (ref 1.0–2.5)
ALT: 22 U/L (ref 6–29)
AST: 23 U/L (ref 10–30)
Albumin: 4.1 g/dL (ref 3.6–5.1)
Alkaline phosphatase (APISO): 40 U/L (ref 31–125)
BUN: 20 mg/dL (ref 7–25)
CO2: 27 mmol/L (ref 20–32)
Calcium: 9.1 mg/dL (ref 8.6–10.2)
Chloride: 103 mmol/L (ref 98–110)
Creat: 0.76 mg/dL (ref 0.50–0.97)
Globulin: 2.8 g/dL (calc) (ref 1.9–3.7)
Glucose, Bld: 95 mg/dL (ref 65–139)
Potassium: 4.9 mmol/L (ref 3.5–5.3)
Sodium: 137 mmol/L (ref 135–146)
Total Bilirubin: 0.4 mg/dL (ref 0.2–1.2)
Total Protein: 6.9 g/dL (ref 6.1–8.1)
eGFR: 103 mL/min/{1.73_m2} (ref >=60)

## 2021-10-31 LAB — LIPID PANEL W/REFLEX DIRECT LDL
Cholesterol: 155 mg/dL (ref <200)
HDL: 65 mg/dL (ref >=50)
LDL Cholesterol (Calc): 76 mg/dL (calc)
Non-HDL Cholesterol (Calc): 90 mg/dL (calc) (ref <130)
Total CHOL/HDL Ratio: 2.4 (calc) (ref <5.0)
Triglycerides: 68 mg/dL (ref <150)

## 2021-10-31 LAB — CBC
HCT: 39.5 % (ref 35.0–45.0)
Hemoglobin: 13.2 g/dL (ref 11.7–15.5)
MCH: 30.8 pg (ref 27.0–33.0)
MCHC: 33.4 g/dL (ref 32.0–36.0)
MCV: 92.1 fL (ref 80.0–100.0)
MPV: 10.3 fL (ref 7.5–12.5)
Platelets: 279 10*3/uL (ref 140–400)
RBC: 4.29 10*6/uL (ref 3.80–5.10)
RDW: 11.6 % (ref 11.0–15.0)
WBC: 6.4 10*3/uL (ref 3.8–10.8)

## 2021-10-31 NOTE — Progress Notes (Signed)
Your lab work is within acceptable range and there are no concerning findings.   ?

## 2021-11-02 ENCOUNTER — Telehealth: Payer: Self-pay

## 2021-11-02 NOTE — Telephone Encounter (Addendum)
Initiated Prior authorization [id number] HCl 10MG  capsules ?Via: Covermymeds ?Case/Key:[id number] ?Status: approved as of 11/02/21 ?Reason: Start Date:10/03/2021;Coverage End Date:11/02/2022; ?Notified Pt via: Mychart ?

## 2021-11-21 ENCOUNTER — Encounter: Payer: Self-pay | Admitting: Family Medicine

## 2021-12-14 ENCOUNTER — Encounter: Payer: Self-pay | Admitting: Family Medicine

## 2021-12-14 ENCOUNTER — Ambulatory Visit (INDEPENDENT_AMBULATORY_CARE_PROVIDER_SITE_OTHER): Payer: BC Managed Care – PPO | Admitting: Family Medicine

## 2021-12-14 VITALS — BP 137/85 | HR 84 | Resp 16 | Ht 62.0 in | Wt 168.0 lb

## 2021-12-14 DIAGNOSIS — R5383 Other fatigue: Secondary | ICD-10-CM | POA: Diagnosis not present

## 2021-12-14 DIAGNOSIS — R110 Nausea: Secondary | ICD-10-CM | POA: Diagnosis not present

## 2021-12-14 DIAGNOSIS — R142 Eructation: Secondary | ICD-10-CM | POA: Diagnosis not present

## 2021-12-14 DIAGNOSIS — R1010 Upper abdominal pain, unspecified: Secondary | ICD-10-CM | POA: Diagnosis not present

## 2021-12-14 MED ORDER — SUCRALFATE 1 G PO TABS: 1.0000 g | ORAL_TABLET | Freq: Three times a day (TID) | ORAL | 1 refills | Status: DC

## 2021-12-14 NOTE — Patient Instructions (Signed)
Please increase your Nexium to Twice a day  ?

## 2021-12-14 NOTE — Progress Notes (Signed)
? ?Established Patient Office Visit ? ?Subjective   ?Patient ID: Jemimah Cressy, female    DOB: October 31, 1985  Age: 36 y.o. MRN: [id number] ? ?Chief Complaint  ?Patient presents with  ? Fatigue  ?  Patient states she has been extremely fatigued for 2 months.   ? Abdominal Pain  ?  Upper abdomen cramping/spasms, Burping, nausea. Occurred 3 times over the last 5 months. Patient also complains of constipation and diarrhea.   ? ? ?HPI ? ?Patient states she has been extremely fatigued for several months.  She says it started initially about a month after she started TB medication.  She completed her course of medication in December and really just thought it was the medicine causing a side effect and thought she would start to feel better but she really has not.  She says she is even had a couple of weekends where she was able to sleep extra long time and still just would wake up feeling exhausted she denies feeling down or depressed she does struggle with mood but feels like it is not worse than usual no particular triggers.  She denies excess snoring.  No fevers or hot flashes at night. ? ?Upper abdomen cramping/spasms, Burping, nausea. Occurred 3 times over the last 5 months. Patient also complains of constipation and diarrhea with intermittent changes in her bowel movements.  She is also had about 3 specific episodes where she had pretty significant epigastric pain that almost felt like a pregnancy "contraction" that lasted about 45 minutes to the point where she broke out in sweats.  She said it happened twice about 2 weeks apart maybe for 5 months ago.  And then happened again recently.  She does take either Nexium or Prilosec pretty routinely.  She has had more GERD recently where she has been having reflux.  She does report that her father had to have his gallbladder removed in his 30s ? ? ? ?ROS ? ?  ?Objective:  ?  ? ?BP 137/85   Pulse 84   Resp 16   Ht 5\' 2"  (1.575 m)   Wt 168 lb (76.2 kg)   SpO2 98%   BMI  30.73 kg/m?  ? ? ?Physical Exam ?Vitals and nursing note reviewed.  ?Constitutional:   ?   Appearance: She is well-developed.  ?HENT:  ?   Head: Normocephalic and atraumatic.  ?Cardiovascular:  ?   Rate and Rhythm: Normal rate and regular rhythm.  ?   Heart sounds: Normal heart sounds.  ?Pulmonary:  ?   Effort: Pulmonary effort is normal.  ?   Breath sounds: Normal breath sounds.  ?Abdominal:  ?   General: Bowel sounds are normal.  ?   Tenderness: There is abdominal tenderness in the epigastric area. There is no guarding or rebound.  ?Skin: ?   General: Skin is warm and dry.  ?Neurological:  ?   Mental Status: She is alert and oriented to person, place, and time.  ?Psychiatric:     ?   Behavior: Behavior normal.  ? ? ? ?No results found for any visits on 12/14/21. ? ? ? ?The ASCVD Risk score (Arnett DK, et al., 2019) failed to calculate for the following reasons: ?  The 2019 ASCVD risk score is only valid for ages 61 to 79 ? ?  ?Assessment & Plan:  ? ?Problem List Items Addressed This Visit   ?None ?Visit Diagnoses   ? ? Other fatigue    -  Primary  ? Relevant  Orders  ? CBC with Differential/Platelet  ? COMPLETE METABOLIC PANEL WITH GFR  ? Lipase  ? Hepatitis, Acute  ? Celiac Disease Panel  ? Sedimentation rate  ? C-reactive protein  ? US Abdomen Complete  ? Upper abdominal pain      ? Relevant Medications  ? sucralfate (CARAFATE) 1 g tablet  ? Other Relevant Orders  ? CBC with Differential/Platelet  ? COMPLETE METABOLIC PANEL WITH GFR  ? Lipase  ? Hepatitis, Acute  ? Celiac Disease Panel  ? Sedimentation rate  ? C-reactive protein  ? US Abdomen Complete  ? Nausea      ? Relevant Medications  ? sucralfate (CARAFATE) 1 g tablet  ? Other Relevant Orders  ? CBC with Differential/Platelet  ? COMPLETE METABOLIC PANEL WITH GFR  ? Lipase  ? Hepatitis, Acute  ? Celiac Disease Panel  ? Sedimentation rate  ? C-reactive protein  ? US Abdomen Complete  ? Belching      ? Relevant Medications  ? sucralfate (CARAFATE) 1 g tablet   ? Other Relevant Orders  ? CBC with Differential/Platelet  ? COMPLETE METABOLIC PANEL WITH GFR  ? Lipase  ? Hepatitis, Acute  ? Celiac Disease Panel  ? Sedimentation rate  ? C-reactive protein  ? US Abdomen Complete  ? ?  ? ?Fatigue-unclear etiology it sounds like its been going on for a little bit longer than the GI symptoms.  She actually just had blood work done at the end of March so probably about 5 or 6 weeks ago.  But we will check her TSH as well as a CBC and inflammatory markers. ? ?Epigastric pain with nausea and belching-consider GERD versus gastritis versus gastric ulcer versus pancreatitis versus hepatitis versus gallbladder.  We will get some additional labs today and schedule her for an ultrasound probably early next week for further work-up.  In the meantime would like to have her increase her Nexium to twice a day at least through the weekend to see if that is helpful. ? ?Return if symptoms worsen or fail to improve.  ? ? ?Nani Gasser, MD ? ?

## 2021-12-15 NOTE — Progress Notes (Signed)
Hi Shirley Ruiz, your labs look good so far.  No sign of pancreatitis.  Still a couple of labs are pending.  Make sure taking your reflux medication twice a day for the next 2 weeks.  Take about 20 min before first meal of day and again at bedtime.  Avoid spicey and acidic foods like coffee and citrus fruits and tomatoes.  Avoid caffeine in general as it can irritate the stomach.  Would could also consider checking for h pylori which is a bacteria that can live in your stomach if this doesn't help much.

## 2021-12-19 LAB — CBC WITH DIFFERENTIAL/PLATELET
Absolute Monocytes: 640 cells/uL (ref 200–950)
Basophils Absolute: 16 cells/uL (ref 0–200)
Basophils Relative: 0.2 %
Eosinophils Absolute: 122 cells/uL (ref 15–500)
Eosinophils Relative: 1.5 %
HCT: 42.1 % (ref 35.0–45.0)
Hemoglobin: 14 g/dL (ref 11.7–15.5)
Lymphs Abs: 2479 cells/uL (ref 850–3900)
MCH: 30.1 pg (ref 27.0–33.0)
MCHC: 33.3 g/dL (ref 32.0–36.0)
MCV: 90.5 fL (ref 80.0–100.0)
MPV: 10.5 fL (ref 7.5–12.5)
Monocytes Relative: 7.9 %
Neutro Abs: 4844 cells/uL (ref 1500–7800)
Neutrophils Relative %: 59.8 %
Platelets: 303 10*3/uL (ref 140–400)
RBC: 4.65 10*6/uL (ref 3.80–5.10)
RDW: 12.3 % (ref 11.0–15.0)
Total Lymphocyte: 30.6 %
WBC: 8.1 10*3/uL (ref 3.8–10.8)

## 2021-12-19 LAB — COMPLETE METABOLIC PANEL WITH GFR
AG Ratio: 1.5 (calc) (ref 1.0–2.5)
ALT: 17 U/L (ref 6–29)
AST: 16 U/L (ref 10–30)
Albumin: 4.4 g/dL (ref 3.6–5.1)
Alkaline phosphatase (APISO): 42 U/L (ref 31–125)
BUN: 23 mg/dL (ref 7–25)
CO2: 28 mmol/L (ref 20–32)
Calcium: 9.3 mg/dL (ref 8.6–10.2)
Chloride: 104 mmol/L (ref 98–110)
Creat: 0.63 mg/dL (ref 0.50–0.97)
Globulin: 2.9 g/dL (calc) (ref 1.9–3.7)
Glucose, Bld: 95 mg/dL (ref 65–139)
Potassium: 4.6 mmol/L (ref 3.5–5.3)
Sodium: 138 mmol/L (ref 135–146)
Total Bilirubin: 0.4 mg/dL (ref 0.2–1.2)
Total Protein: 7.3 g/dL (ref 6.1–8.1)
eGFR: 117 mL/min/{1.73_m2} (ref >=60)

## 2021-12-19 LAB — HEPATITIS PANEL, ACUTE
Hep A IgM: NONREACTIVE
Hep B C IgM: NONREACTIVE
Hepatitis B Surface Ag: NONREACTIVE
Hepatitis C Ab: NONREACTIVE
SIGNAL TO CUT-OFF: 0.08 (ref <1.00)

## 2021-12-19 LAB — SEDIMENTATION RATE: Sed Rate: 22 mm/h — ABNORMAL HIGH (ref 0–20)

## 2021-12-19 LAB — CELIAC DISEASE PANEL
(tTG) Ab, IgA: <1 U/mL
(tTG) Ab, IgG: <1 U/mL
Gliadin IgA: <1 U/mL
Gliadin IgG: <1 U/mL
Immunoglobulin A: 472 mg/dL — ABNORMAL HIGH (ref 47–310)

## 2021-12-19 LAB — LIPASE: Lipase: 33 U/L (ref 7–60)

## 2021-12-19 LAB — C-REACTIVE PROTEIN: CRP: 1.8 mg/L (ref <8.0)

## 2021-12-19 NOTE — Progress Notes (Signed)
Hi Shikita, no sign of celiac disease based on your blood work.  Negative for acute hepatitis.

## 2021-12-25 ENCOUNTER — Ambulatory Visit (INDEPENDENT_AMBULATORY_CARE_PROVIDER_SITE_OTHER): Payer: BC Managed Care – PPO

## 2021-12-25 DIAGNOSIS — R5383 Other fatigue: Secondary | ICD-10-CM

## 2021-12-25 DIAGNOSIS — R142 Eructation: Secondary | ICD-10-CM

## 2021-12-25 DIAGNOSIS — R110 Nausea: Secondary | ICD-10-CM

## 2021-12-25 DIAGNOSIS — R1010 Upper abdominal pain, unspecified: Secondary | ICD-10-CM | POA: Diagnosis not present

## 2021-12-25 DIAGNOSIS — R1084 Generalized abdominal pain: Secondary | ICD-10-CM | POA: Diagnosis not present

## 2021-12-25 NOTE — Progress Notes (Signed)
Hi Shirley Ruiz, the good news is just your ultrasound looks good no worrisome features.  No gallstones or gallbladder thickening.  The liver also looks normal.  Spleen and kidneys also look good.  Nothing specific to explain your pain.  Neck step would be to consider referral to gastroenterology for further work-up.

## 2021-12-26 ENCOUNTER — Encounter: Payer: Self-pay | Admitting: Family Medicine

## 2021-12-26 DIAGNOSIS — R110 Nausea: Secondary | ICD-10-CM

## 2021-12-26 DIAGNOSIS — R142 Eructation: Secondary | ICD-10-CM

## 2021-12-26 DIAGNOSIS — R1010 Upper abdominal pain, unspecified: Secondary | ICD-10-CM

## 2021-12-26 NOTE — Telephone Encounter (Signed)
Okay to place GI referral.

## 2022-02-26 ENCOUNTER — Other Ambulatory Visit: Payer: Self-pay | Admitting: Family Medicine

## 2022-02-26 DIAGNOSIS — F4323 Adjustment disorder with mixed anxiety and depressed mood: Secondary | ICD-10-CM

## 2022-02-27 NOTE — Telephone Encounter (Signed)
Last written 10/30/2021 #20 no refills Last appt 12/14/2021

## 2022-03-12 ENCOUNTER — Encounter: Payer: Self-pay | Admitting: Family Medicine

## 2022-03-12 ENCOUNTER — Emergency Department (EMERGENCY_DEPARTMENT_HOSPITAL)
Admission: EM | Admit: 2022-03-12 | Discharge: 2022-03-13 | Disposition: A | Discharge status: Home / Self Care | Payer: BC Managed Care – PPO | Source: Home / Self Care | Attending: Emergency Medicine | Admitting: Emergency Medicine

## 2022-03-12 ENCOUNTER — Ambulatory Visit (HOSPITAL_COMMUNITY)
Admission: AD | Admit: 2022-03-12 | Discharge: 2022-03-12 | Disposition: A | Discharge status: Home / Self Care | Payer: BC Managed Care – PPO | Attending: Psychiatry | Admitting: Psychiatry

## 2022-03-12 DIAGNOSIS — B009 Herpesviral infection, unspecified: Secondary | ICD-10-CM | POA: Diagnosis not present

## 2022-03-12 DIAGNOSIS — F40298 Other specified phobia: Secondary | ICD-10-CM

## 2022-03-12 DIAGNOSIS — K589 Irritable bowel syndrome without diarrhea: Secondary | ICD-10-CM | POA: Diagnosis not present

## 2022-03-12 DIAGNOSIS — F32A Depression, unspecified: Secondary | ICD-10-CM

## 2022-03-12 DIAGNOSIS — F1210 Cannabis abuse, uncomplicated: Secondary | ICD-10-CM | POA: Diagnosis not present

## 2022-03-12 DIAGNOSIS — R45851 Suicidal ideations: Secondary | ICD-10-CM | POA: Insufficient documentation

## 2022-03-12 DIAGNOSIS — G4700 Insomnia, unspecified: Secondary | ICD-10-CM | POA: Diagnosis not present

## 2022-03-12 DIAGNOSIS — K219 Gastro-esophageal reflux disease without esophagitis: Secondary | ICD-10-CM | POA: Diagnosis not present

## 2022-03-12 DIAGNOSIS — Z7289 Other problems related to lifestyle: Secondary | ICD-10-CM

## 2022-03-12 DIAGNOSIS — Z9152 Personal history of nonsuicidal self-harm: Secondary | ICD-10-CM | POA: Diagnosis not present

## 2022-03-12 DIAGNOSIS — F4310 Post-traumatic stress disorder, unspecified: Secondary | ICD-10-CM | POA: Diagnosis not present

## 2022-03-12 DIAGNOSIS — Z814 Family history of other substance abuse and dependence: Secondary | ICD-10-CM | POA: Diagnosis not present

## 2022-03-12 DIAGNOSIS — F4323 Adjustment disorder with mixed anxiety and depressed mood: Secondary | ICD-10-CM | POA: Insufficient documentation

## 2022-03-12 DIAGNOSIS — Z818 Family history of other mental and behavioral disorders: Secondary | ICD-10-CM | POA: Diagnosis not present

## 2022-03-12 DIAGNOSIS — F332 Major depressive disorder, recurrent severe without psychotic features: Secondary | ICD-10-CM | POA: Diagnosis not present

## 2022-03-12 DIAGNOSIS — Z20822 Contact with and (suspected) exposure to covid-19: Secondary | ICD-10-CM | POA: Insufficient documentation

## 2022-03-12 DIAGNOSIS — G43909 Migraine, unspecified, not intractable, without status migrainosus: Secondary | ICD-10-CM | POA: Diagnosis not present

## 2022-03-12 DIAGNOSIS — J45909 Unspecified asthma, uncomplicated: Secondary | ICD-10-CM | POA: Diagnosis not present

## 2022-03-12 MED ORDER — ACETAMINOPHEN 325 MG PO TABS: 650.0000 mg | ORAL_TABLET | ORAL | Status: DC | PRN

## 2022-03-12 MED ORDER — LORAZEPAM 1 MG PO TABS
1.0000 mg | ORAL_TABLET | Freq: Once | ORAL | Status: AC
  Administered 2022-03-13: 1 mg via ORAL
  Filled 2022-03-12: qty 1

## 2022-03-12 NOTE — H&P (Signed)
Behavioral Health Medical Screening Exam  Shirley Ruiz is a 36 y.o. female.  With a history of depression and anxiety and most recently PTSD.  Presented to [facility] voluntarily with her friend, complaining of suicide ideation by cutting herself.  According to the patient she has been cutting her sounds since last night, per the patient I have severe depression that cause me to self-harm.  Per the patient she is not seeing a psychiatrist or therapist, however she sees her primary care physician who is prescribing Prozac and Klonopin.  Patient was diagnosed a couple years ago with anxiety, depression, PTSD according to patient she was seeing a therapist few years back.  Patient denies hospitalization for any psychiatric issues.    Face-to-face observation of patient, patient is alert and oriented x 4, speech is clear, maintaining eye contact.  Mood is labile and depressed affect congruent with mood.  Patient reports SI with plans to cut herself on her arm, patient denies HI, AVH, or paranoia.  Patient reports she drinks alcohol socially on the weekends.  Patient reports she smoked marijuana every so often.  Denies any other illicit drug use.  Patient reports stressors, related to her daughter moving out and moving in with her father.  Patient states she does have full custody of her daughter but her daughter refused to stay with her.  Patient also reports she feels like everyone is about to leave her or die.  Patient reports she has a gun in the home but is locked in a safe patient lives alone.  Patient frame who is accompanied her Jae Dire), states she will go to the house and collect the gun and keep it away from the patient.  Recommend inpatient admission, per the [facility] have no available beds,  called [facility] but they have no beds,  NP call [facility], Dr Rubin Payor MD agree to accept the patient.  Pt will go via safe transportation.   EMTALA completed   Total Time spent with patient: 20 minutes  Psychiatric  Specialty Exam:  Presentation  General Appearance: No data recorded Eye Contact:No data recorded Speech:No data recorded Speech Volume:No data recorded Handedness:No data recorded  Mood and Affect  Mood:No data recorded Affect:No data recorded  Thought Process  Thought Processes:No data recorded Descriptions of Associations:No data recorded Orientation:No data recorded Thought Content:No data recorded History of Schizophrenia/Schizoaffective disorder:No data recorded Duration of Psychotic Symptoms:No data recorded Hallucinations:No data recorded Ideas of Reference:No data recorded Suicidal Thoughts:No data recorded Homicidal Thoughts:No data recorded  Sensorium  Memory:No data recorded Judgment:No data recorded Insight:No data recorded  Executive Functions  Concentration:No data recorded Attention Span:No data recorded Recall:No data recorded Fund of Knowledge:No data recorded Language:No data recorded  Psychomotor Activity  Psychomotor Activity:No data recorded  Assets  Assets:No data recorded  Sleep  Sleep:No data recorded   Physical Exam: Physical Exam HENT:     Head: Normocephalic.     Nose: Nose normal.  Cardiovascular:     Rate and Rhythm: Normal rate.  Pulmonary:     Effort: Pulmonary effort is normal.  Musculoskeletal:        General: Normal range of motion.     Cervical back: Normal range of motion.  Neurological:     General: No focal deficit present.     Mental Status: She is alert.  Psychiatric:        Mood and Affect: Mood normal.        Behavior: Behavior normal.  Thought Content: Thought content normal.        Judgment: Judgment normal.   Review of Systems  Constitutional: Negative.   HENT: Negative.    Eyes: Negative.   Respiratory: Negative.    Cardiovascular: Negative.   Gastrointestinal: Negative.   Genitourinary: Negative.   Musculoskeletal: Negative.   Skin: Negative.   Neurological: Negative.    Endo/Heme/Allergies: Negative.   Psychiatric/Behavioral:  Positive for suicidal ideas. The patient is nervous/anxious.    There were no vitals taken for this visit. There is no height or weight on file to calculate BMI.  Musculoskeletal: Strength & Muscle Tone: within normal limits Gait & Station: normal Patient leans: N/A  [place] Scale:  Flowsheet Row OP Visit from 03/12/2022 in [facility] Video Visit from 12/05/2020 in [facility]  C-SSRS RISK CATEGORY Low Risk Error: Question 6 not populated       Recommendations:  Based on my evaluation the patient does not appear to have an emergency medical condition.    Sindy Guadeloupe, NP 03/12/2022, 8:39 PM

## 2022-03-12 NOTE — ED Triage Notes (Signed)
Pt from [facility], states she went there after "a severe bout of depression caused me to cut myself in multiple places, wrist included." Butterfly closure to same. Pt compliant w home prozac & klonopin. Hx anxiety, depression, PTSD. Pt calm, cooperative in triage.

## 2022-03-13 ENCOUNTER — Other Ambulatory Visit: Payer: Self-pay

## 2022-03-13 ENCOUNTER — Inpatient Hospital Stay (HOSPITAL_COMMUNITY)
Admission: AD | Admit: 2022-03-13 | Discharge: 2022-03-18 | DRG: 885 | Disposition: A | Discharge status: Home / Self Care | Payer: BC Managed Care – PPO | Source: Intra-hospital | Attending: Psychiatry | Admitting: Psychiatry

## 2022-03-13 ENCOUNTER — Encounter (HOSPITAL_COMMUNITY): Payer: Self-pay | Admitting: Nurse Practitioner

## 2022-03-13 DIAGNOSIS — Z9152 Personal history of nonsuicidal self-harm: Secondary | ICD-10-CM | POA: Diagnosis not present

## 2022-03-13 DIAGNOSIS — G43909 Migraine, unspecified, not intractable, without status migrainosus: Secondary | ICD-10-CM | POA: Diagnosis present

## 2022-03-13 DIAGNOSIS — K589 Irritable bowel syndrome without diarrhea: Secondary | ICD-10-CM | POA: Diagnosis present

## 2022-03-13 DIAGNOSIS — K219 Gastro-esophageal reflux disease without esophagitis: Secondary | ICD-10-CM | POA: Diagnosis present

## 2022-03-13 DIAGNOSIS — B009 Herpesviral infection, unspecified: Secondary | ICD-10-CM | POA: Diagnosis present

## 2022-03-13 DIAGNOSIS — G4700 Insomnia, unspecified: Secondary | ICD-10-CM | POA: Diagnosis present

## 2022-03-13 DIAGNOSIS — J45909 Unspecified asthma, uncomplicated: Secondary | ICD-10-CM | POA: Diagnosis present

## 2022-03-13 DIAGNOSIS — R45851 Suicidal ideations: Secondary | ICD-10-CM | POA: Diagnosis present

## 2022-03-13 DIAGNOSIS — F332 Major depressive disorder, recurrent severe without psychotic features: Secondary | ICD-10-CM | POA: Diagnosis present

## 2022-03-13 DIAGNOSIS — Z814 Family history of other substance abuse and dependence: Secondary | ICD-10-CM | POA: Diagnosis not present

## 2022-03-13 DIAGNOSIS — F4310 Post-traumatic stress disorder, unspecified: Secondary | ICD-10-CM | POA: Diagnosis present

## 2022-03-13 DIAGNOSIS — F4323 Adjustment disorder with mixed anxiety and depressed mood: Secondary | ICD-10-CM | POA: Diagnosis present

## 2022-03-13 DIAGNOSIS — Z20822 Contact with and (suspected) exposure to covid-19: Secondary | ICD-10-CM | POA: Diagnosis present

## 2022-03-13 DIAGNOSIS — F1210 Cannabis abuse, uncomplicated: Secondary | ICD-10-CM | POA: Diagnosis present

## 2022-03-13 DIAGNOSIS — Z818 Family history of other mental and behavioral disorders: Secondary | ICD-10-CM

## 2022-03-13 LAB — CBC WITH DIFFERENTIAL/PLATELET
Abs Immature Granulocytes: 0.01 10*3/uL (ref 0.00–0.07)
Basophils Absolute: 0 10*3/uL (ref 0.0–0.1)
Basophils Relative: 0 %
Eosinophils Absolute: 0.2 10*3/uL (ref 0.0–0.5)
Eosinophils Relative: 2 %
HCT: 39 % (ref 36.0–46.0)
Hemoglobin: 13.2 g/dL (ref 12.0–15.0)
Immature Granulocytes: 0 %
Lymphocytes Relative: 42 %
Lymphs Abs: 3.3 10*3/uL (ref 0.7–4.0)
MCH: 30.8 pg (ref 26.0–34.0)
MCHC: 33.8 g/dL (ref 30.0–36.0)
MCV: 90.9 fL (ref 80.0–100.0)
Monocytes Absolute: 0.7 10*3/uL (ref 0.1–1.0)
Monocytes Relative: 9 %
Neutro Abs: 3.7 10*3/uL (ref 1.7–7.7)
Neutrophils Relative %: 47 %
Platelets: 332 10*3/uL (ref 150–400)
RBC: 4.29 MIL/uL (ref 3.87–5.11)
RDW: 12.5 % (ref 11.5–15.5)
WBC: 7.9 10*3/uL (ref 4.0–10.5)
nRBC: 0 % (ref 0.0–0.2)

## 2022-03-13 LAB — COMPREHENSIVE METABOLIC PANEL
ALT: 23 U/L (ref 0–44)
AST: 23 U/L (ref 15–41)
Albumin: 4 g/dL (ref 3.5–5.0)
Alkaline Phosphatase: 43 U/L (ref 38–126)
Anion gap: 10 (ref 5–15)
BUN: 13 mg/dL (ref 6–20)
CO2: 26 mmol/L (ref 22–32)
Calcium: 9.2 mg/dL (ref 8.9–10.3)
Chloride: 103 mmol/L (ref 98–111)
Creatinine, Ser: 0.79 mg/dL (ref 0.44–1.00)
GFR, Estimated: >60 mL/min (ref >60)
Glucose, Bld: 86 mg/dL (ref 70–99)
Potassium: 3.6 mmol/L (ref 3.5–5.1)
Sodium: 139 mmol/L (ref 135–145)
Total Bilirubin: 0.7 mg/dL (ref 0.3–1.2)
Total Protein: 7.7 g/dL (ref 6.5–8.1)

## 2022-03-13 LAB — RAPID URINE DRUG SCREEN, HOSP PERFORMED
Amphetamines: NOT DETECTED
Barbiturates: NOT DETECTED
Benzodiazepines: NOT DETECTED
Cocaine: NOT DETECTED
Opiates: NOT DETECTED
Tetrahydrocannabinol: POSITIVE — AB

## 2022-03-13 LAB — RESP PANEL BY RT-PCR (FLU A&B, COVID) ARPGX2
Influenza A by PCR: NEGATIVE
Influenza B by PCR: NEGATIVE
SARS Coronavirus 2 by RT PCR: NEGATIVE

## 2022-03-13 LAB — ETHANOL: Alcohol, Ethyl (B): <10 mg/dL (ref <10)

## 2022-03-13 MED ORDER — HYDROXYZINE HCL 25 MG PO TABS: 25.0000 mg | ORAL_TABLET | Freq: Three times a day (TID) | ORAL | Status: DC | PRN

## 2022-03-13 MED ORDER — MAGNESIUM HYDROXIDE 400 MG/5ML PO SUSP
30.0000 mL | Freq: Every day | ORAL | Status: DC | PRN
  Administered 2022-03-16: 30 mL via ORAL
  Filled 2022-03-13: qty 30

## 2022-03-13 MED ORDER — ACETAMINOPHEN 325 MG PO TABS
650.0000 mg | ORAL_TABLET | Freq: Four times a day (QID) | ORAL | Status: DC | PRN
  Administered 2022-03-16: 650 mg via ORAL
  Filled 2022-03-13: qty 2

## 2022-03-13 MED ORDER — LORAZEPAM 1 MG PO TABS
1.0000 mg | ORAL_TABLET | Freq: Once | ORAL | Status: AC
  Administered 2022-03-13: 1 mg via ORAL
  Filled 2022-03-13: qty 1

## 2022-03-13 MED ORDER — TRAZODONE HCL 50 MG PO TABS
50.0000 mg | ORAL_TABLET | Freq: Every evening | ORAL | Status: DC | PRN
  Administered 2022-03-13 – 2022-03-14 (×2): 50 mg via ORAL
  Filled 2022-03-13 (×2): qty 1

## 2022-03-13 MED ORDER — ALUM & MAG HYDROXIDE-SIMETH 200-200-20 MG/5ML PO SUSP
30.0000 mL | ORAL | Status: DC | PRN
  Administered 2022-03-15: 30 mL via ORAL
  Filled 2022-03-13: qty 30

## 2022-03-13 MED ORDER — FLUOXETINE HCL 20 MG PO CAPS
20.0000 mg | ORAL_CAPSULE | Freq: Every day | ORAL | Status: DC
  Administered 2022-03-14: 20 mg via ORAL
  Filled 2022-03-13 (×3): qty 1

## 2022-03-13 MED ORDER — HYDROXYZINE HCL 25 MG PO TABS
25.0000 mg | ORAL_TABLET | Freq: Three times a day (TID) | ORAL | Status: DC | PRN
  Administered 2022-03-13 – 2022-03-18 (×6): 25 mg via ORAL
  Filled 2022-03-13 (×6): qty 1

## 2022-03-13 MED ORDER — TRAZODONE HCL 50 MG PO TABS: 50.0000 mg | ORAL_TABLET | Freq: Every evening | ORAL | Status: DC | PRN

## 2022-03-13 MED ORDER — FLUOXETINE HCL 20 MG PO CAPS
20.0000 mg | ORAL_CAPSULE | Freq: Every day | ORAL | Status: DC
  Administered 2022-03-13: 20 mg via ORAL
  Filled 2022-03-13: qty 1

## 2022-03-13 NOTE — ED Notes (Signed)
Important numbers per pt: Joya San: [phone] Jacky Kindle: [phone] Frances Furbish: [phone] Sampson Si: [phone] Dollene Primrose: [phone]  Pt gave belongings including cellphone & wallet to Joya San. Pt in burgundy scrubs, wanded by security

## 2022-03-13 NOTE — Progress Notes (Signed)
Pt did not attend NA meeting.  

## 2022-03-13 NOTE — Consult Note (Addendum)
Shirley Ruiz [facility] ED ASSESSMENT   Reason for Consult:  Eval/IP Referring Physician:  Dr. Lucianne Muss Patient Identification: Shirley Ruiz MRN:  [id number] ED Chief Complaint: Suicidal ideation  Diagnosis:  Principal Problem:   Suicidal ideation Active Problems:   Adjustment disorder with mixed anxiety and depressed mood   ED Assessment Time Calculation: Start Time: 1030 Stop Time: 1100 Total Time in Minutes (Assessment Completion): 30   Subjective:   Shirley Ruiz is a 36 y.o. female patient who originally presented voluntarily to [facility] with her friend complaining of suicidal ideations and cutting herself.  Due to lacerations on wrist patient was transferred to [facility] ED and recommended for inpatient psychiatric treatment.  HPI:   Seen this morning at [facility] ED for face-to-face evaluation.  Patient tells me she originally presented to [facility] after she became very depressed and started to cut her thigh and her wrist.  Patient stated she has self harmed before but it was around 5 to 6 years ago.  Patient stated when she started cutting her wrist she did not have the intentions of killing herself, but she stated she just kept cutting deeper and deeper.  Patient stated after reflecting on it she does feel like she was trying to kill herself.  She denies any homicidal ideations.  She denies any auditory or visual hallucinations.  She continues to endorse SI today, denies plan or intent.  Did she does feel some relief today, especially since she is in a safe environment.  She tells me her suicidal thoughts and increased depression was triggered by her daughter going to her father's house and now her daughter will not return home.  Patient states that she is not sure what her daughter said to the father (her ex-husband) but he changed her phone number and is now filing for emergency custody.  She tells me she has had primary custody of her daughter for 8 years now.  I asked patient if she had any clue what could have happened and  patient stated no.  I asked if I could reach out to her ex-husband for collateral and patient stated she does not want her ex-husband or her daughter contacted while she is in the hospital.   We did talk about the weapon in her home and did assure me her friend is going to her house to collect the gun and take it away from the patient.  She does see a primary care physician who prescribes her Prozac and Klonopin.  She does feel like Prozac works well for her and would like to continue it.  She does endorse occasional marijuana use.  Endorses social alcohol consumption.  Denies any other substance use.  She mentions having trouble falling asleep due to racing thoughts, will start trazodone for her.  Will continue to recommend inpatient psychiatric treatment.  Past Psychiatric History:  Reported history of anxiety, depression, and PTSD.  Risk to Self or Others: Is the patient at risk to self? Yes Has the patient been a risk to self in the past 6 months? No Has the patient been a risk to self within the distant past? No Is the patient a risk to others? No Has the patient been a risk to others in the past 6 months? No Has the patient been a risk to others within the distant past? No  [place] Scale:  Flowsheet Row ED from 03/12/2022 in [facility] EMERGENCY DEPARTMENT Most recent reading at 03/12/2022 11:26 PM OP Visit from 03/12/2022 in [facility] Most recent reading at 03/12/2022  8:37 PM Video Visit from 12/05/2020 in [facility] Most recent reading at 12/05/2020  3:11 PM  C-SSRS RISK CATEGORY High Risk Low Risk Error: Question 6 not populated       Past Medical History:  Past Medical History:  Diagnosis Date   Abnormal Pap smear 01/2009   .HGSIL, Colpo and Cryo   Anxiety    Asthma    Depression    Diabetes in pregnancy    Gestational   HSV-1 (herpes simplex virus 1) infection    HSV-2 (herpes simplex virus  2) infection    Intervertebral disc protrusion    Mental disorder    H/O depression   Migraines    PVC (premature ventricular contraction)     Past Surgical History:  Procedure Laterality Date   CRYOTHERAPY     For cervical dysplasia   MOUTH SURGERY     Cut in mouth to remove an extra tooth   Family History:  Family History  Problem Relation Age of Onset   Depression Mother    Hypertension Mother    Heart disease Mother    Heart attack Father    Healthy Sister    Depression Maternal Grandmother    Heart disease Maternal Grandfather    Social History:  Social History   Substance and Sexual Activity  Alcohol Use Yes   Alcohol/week: 1.0 standard drink of alcohol   Types: 1 Shots of liquor per week   Comment: occasionally as a social drinker liquor     Social History   Substance and Sexual Activity  Drug Use No    Social History   Socioeconomic History   Marital status: Divorced    Spouse name: Not on file   Number of children: 1   Years of education: Not on file   Highest education level: Not on file  Occupational History   Occupation: Nurse, mental health  Tobacco Use   Smoking status: Never   Smokeless tobacco: Never  Vaping Use   Vaping Use: Never used  Substance and Sexual Activity   Alcohol use: Yes    Alcohol/week: 1.0 standard drink of alcohol    Types: 1 Shots of liquor per week    Comment: occasionally as a social drinker liquor   Drug use: No   Sexual activity: Yes    Partners: Male    Birth control/protection: I.U.D.  Other Topics Concern   Not on file  Social History Narrative   Regular exercise 5-6 days week of Cardio and Strength training.  Works at [employer].   Raising her daughter by herself.     Social Determinants of Health   Financial Resource Strain: Not on file  Food Insecurity: Not on file  Transportation Needs: Not on file  Physical Activity: Not on file  Stress: Not on file  Social Connections: Not on file   Additional  Social History:    Allergies:   Allergies  Allergen Reactions   Bupropion Other (See Comments) and Rash    Increased PVCs Increased PVCs Increased PVCs Increased PVCs   Penicillins Hives    Labs:  Results for orders placed or performed during the hospital encounter of 03/12/22 (from the past 48 hour(s))  Rapid urine drug screen (hospital performed)     Status: Abnormal   Collection Time: 03/12/22 12:50 AM  Result Value Ref Range   Opiates NONE DETECTED NONE DETECTED   Cocaine NONE  DETECTED NONE DETECTED   Benzodiazepines NONE DETECTED NONE DETECTED   Amphetamines NONE DETECTED NONE DETECTED   Tetrahydrocannabinol POSITIVE (A) NONE DETECTED   Barbiturates NONE DETECTED NONE DETECTED    Comment: (NOTE) DRUG SCREEN FOR MEDICAL PURPOSES ONLY.  IF CONFIRMATION IS NEEDED FOR ANY PURPOSE, NOTIFY LAB WITHIN 5 DAYS.  LOWEST DETECTABLE LIMITS FOR URINE DRUG SCREEN Drug Class                     Cutoff (ng/mL) Amphetamine and metabolites    1000 Barbiturate and metabolites    200 Benzodiazepine                 200 Tricyclics and metabolites     300 Opiates and metabolites        300 Cocaine and metabolites        300 THC                            50 Performed at [facility], [address], [place], [place] [address]   CBC with Differential     Status: None   Collection Time: 03/12/22 11:58 PM  Result Value Ref Range   WBC 7.9 4.0 - 10.5 K/uL   RBC 4.29 3.87 - 5.11 MIL/uL   Hemoglobin 13.2 12.0 - 15.0 g/dL   HCT 45.8 09.9 - 83.3 %   MCV 90.9 80.0 - 100.0 fL   MCH 30.8 26.0 - 34.0 pg   MCHC 33.8 30.0 - 36.0 g/dL   RDW 82.5 05.3 - 97.6 %   Platelets 332 150 - 400 K/uL   nRBC 0.0 0.0 - 0.2 %   Neutrophils Relative % 47 %   Neutro Abs 3.7 1.7 - 7.7 K/uL   Lymphocytes Relative 42 %   Lymphs Abs 3.3 0.7 - 4.0 K/uL   Monocytes Relative 9 %   Monocytes Absolute 0.7 0.1 - 1.0 K/uL   Eosinophils Relative 2 %   Eosinophils Absolute 0.2 0.0 - 0.5 K/uL   Basophils  Relative 0 %   Basophils Absolute 0.0 0.0 - 0.1 K/uL   Immature Granulocytes 0 %   Abs Immature Granulocytes 0.01 0.00 - 0.07 K/uL    Comment: Performed at [facility], [address] [place], [place] [address]  Comprehensive metabolic panel     Status: None   Collection Time: 03/12/22 11:58 PM  Result Value Ref Range   Sodium 139 135 - 145 mmol/L   Potassium 3.6 3.5 - 5.1 mmol/L   Chloride 103 98 - 111 mmol/L   CO2 26 22 - 32 mmol/L   Glucose, Bld 86 70 - 99 mg/dL    Comment: Glucose reference range applies only to samples taken after fasting for at least 8 hours.   BUN 13 6 - 20 mg/dL   Creatinine, Ser 3.79 0.44 - 1.00 mg/dL   Calcium 9.2 8.9 - 02.4 mg/dL   Total Protein 7.7 6.5 - 8.1 g/dL   Albumin 4.0 3.5 - 5.0 g/dL   AST 23 15 - 41 U/L   ALT 23 0 - 44 U/L   Alkaline Phosphatase 43 38 - 126 U/L   Total Bilirubin 0.7 0.3 - 1.2 mg/dL   GFR, Estimated >09 >73 mL/min    Comment: (NOTE) Calculated using the CKD-EPI Creatinine Equation (2021)    Anion gap 10 5 - 15    Comment: Performed at [facility],  [address], [place], [place] [address]  Ethanol     Status: None   Collection Time: 03/13/22 12:00 AM  Result Value Ref Range   Alcohol, Ethyl (B) <10 <10 mg/dL    Comment: (NOTE) Lowest detectable limit for serum alcohol is 10 mg/dL.  For medical purposes only. Performed at [facility], [address], [place], [place] [address]     Current Facility-Administered Medications  Medication Dose Route Frequency Provider Last Rate Last Admin   acetaminophen (TYLENOL) tablet 650 mg  650 mg Oral Q4H PRN Antony Madura, PA-C       FLUoxetine (PROZAC) capsule 20 mg  20 mg Oral Daily Eligha Bridegroom, NP       hydrOXYzine (ATARAX) tablet 25 mg  25 mg Oral TID PRN Eligha Bridegroom, NP       traZODone (DESYREL) tablet 50 mg  50 mg Oral QHS PRN Eligha Bridegroom, NP       Current Outpatient Medications  Medication Sig Dispense Refill   clonazePAM (KLONOPIN)  0.5 MG tablet TAKE 1 TABLET BY MOUTH ONCE DAILY AS NEEDED FOR ANXIETY 20 tablet 0   albuterol (PROVENTIL) (2.5 MG/3ML) 0.083% nebulizer solution Take 3 mLs (2.5 mg total) by nebulization every 6 (six) hours as needed for wheezing or shortness of breath. 60 mL 1   albuterol (VENTOLIN HFA) 108 (90 Base) MCG/ACT inhaler Inhale 2 puffs into the lungs every 6 (six) hours as needed for wheezing or shortness of breath. 18 g 1   Esomeprazole Magnesium (NEXIUM PO) Take by mouth.     FLUoxetine (PROZAC) 10 MG capsule Take 3 capsules (30 mg total) by mouth daily. 14 DAY SUPPLY GIVEN. MUST SCHEDULE AND KEEP APPOINTMENT FOR REFILLS 270 capsule 3   montelukast (SINGULAIR) 10 MG tablet Take 1 tablet (10 mg total) by mouth at bedtime. 30 tablet 3   ondansetron (ZOFRAN) 8 MG tablet TAKE 1 TABLET BY MOUTH EVERY 8 HOURS AS NEEDED FOR NAUSEA 12 tablet 0   phentermine (ADIPEX-P) 37.5 MG tablet Take 37.5 mg by mouth 2 (two) times daily.     sucralfate (CARAFATE) 1 g tablet Take 1 tablet (1 g total) by mouth 4 (four) times daily -  with meals and at bedtime. 90 tablet 1   SUMAtriptan (IMITREX) 100 MG tablet TAKE ONE TABLET BY MOUTH EVERY 2 HOURS AS NEEDED FOR MIGRAINE 10 tablet 0   valACYclovir (VALTREX) 500 MG tablet Take 1 tablet (500 mg total) by mouth daily. 90 tablet 3    Psychiatric Specialty Exam: Presentation  General Appearance: Appropriate for Environment  Eye Contact:Good  Speech:Clear and Coherent  Speech Volume:Normal  Handedness:No data recorded  Mood and Affect  Mood:Anxious; Depressed  Affect:Congruent   Thought Process  Thought Processes:Coherent  Descriptions of Associations:Intact  Orientation:Full (Time, Place and Person)  Thought Content:Logical  History of Schizophrenia/Schizoaffective disorder:No data recorded Duration of Psychotic Symptoms:No data recorded Hallucinations:Hallucinations: None  Ideas of Reference:None  Suicidal Thoughts:Suicidal Thoughts: Yes, Active SI  Active Intent and/or Plan: Without Intent; Without Plan  Homicidal Thoughts:Homicidal Thoughts: No   Sensorium  Memory:Immediate Good  Judgment:Fair  Insight:Fair   Executive Functions  Concentration:Good  Attention Span:Good  Recall:Good  Fund of Knowledge:Good  Language:Good   Psychomotor Activity  Psychomotor Activity:Psychomotor Activity: Normal   Assets  Assets:Communication Skills; Desire for Improvement; Physical Health; Resilience; Social Support    Sleep  Sleep:Sleep: Fair   Physical Exam: Physical Exam Neurological:     Mental Status: She is alert and oriented  to person, place, and time.  Psychiatric:        Behavior: Behavior is cooperative.        Thought Content: Thought content includes suicidal ideation.    Review of Systems  Psychiatric/Behavioral:  Positive for depression and suicidal ideas. The patient is nervous/anxious.   All other systems reviewed and are negative.  Blood pressure 131/80, pulse 70, temperature 98.6 F (37 C), temperature source Oral, resp. rate 18, SpO2 99 %. There is no height or weight on file to calculate BMI.  Medical Decision Making: Patient case reviewed and discussed with Dr. Lucianne MussKumar.  Patient does meet criteria for IVC and inpatient psychiatric treatment.  Patient has been accepted to behavioral health hospital and can be transferred there today after 1400.  EDP, RN, and LCSW notified of disposition.  - Prozac 20 mg daily restarted - Trazodone 50 mg PRN Qhs started  Disposition: Recommend psychiatric Inpatient admission when medically cleared.  Eligha BridegroomMikaela  Pari Lombard, NP 03/13/2022 11:19 AM

## 2022-03-13 NOTE — ED Provider Notes (Signed)
Emergency Medicine Observation Re-evaluation Note  Shirley Ruiz is a 36 y.o. female, seen on rounds today.  Pt initially presented to the ED for complaints of depression and self injurious behavior. Currently, the patient is asleep in bed resting comfortably.  Physical Exam  BP (!) 141/99 (BP Location: Right Arm)   Pulse 68   Temp 98.5 F (36.9 C) (Oral)   Resp 17   SpO2 99%  Physical Exam General: Asleep in bed Cardiac: Regular rate Lungs: No increased work of breathing Psych: Calm, sleep in bed  ED Course / MDM  EKG:   I have reviewed the labs performed to date as well as medications administered while in observation.  Recent changes in the last 24 hours include anxiety requiring intermittent Ativan.  Patient was seen at [facility] yesterday but does not currently have any beds available.  Labs reviewed and interpreted by myself are within normal range.  UDS positive for THC.  Vital signs stable  Plan  Current plan is for patient placement, pending bed availability and psychiatric evaluation here.  Avagrace Botelho is not under involuntary commitment.     Phoebe Sharps, DO 03/13/22 [phone]

## 2022-03-13 NOTE — Progress Notes (Signed)
Pt was accepted to [facility] Today 03/13/22 PENDING  Negative COVID-19, PCR and EKG are completed; Bed Assignment 300-1  DX: MDD  Pt meets inpatient criteria per Eligha Bridegroom, NP  Attending Physician will be Nadir Abbott Pao, MD  Report can be called to: Adult unit: [phone]  Pt can arrive after 1400  Care Team notified: [facility] Malva Limes, RN, [place] Nickerson, RN, Arturo Morton, DO, Eligha Bridegroom, NP, Garvin Fila, RN.  Kelton Pillar, LCSWA 03/13/2022 @ 1:06 PM
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
## 2022-03-13 NOTE — Progress Notes (Signed)
   03/13/22 2000  Psych Admission Type (Psych Patients Only)  Admission Status Voluntary  Psychosocial Assessment  Patient Complaints Anxiety;Self-harm thoughts  Eye Contact Fair  Facial Expression Anxious;Flat  Affect Anxious  Speech Logical/coherent  Interaction Assertive  Motor Activity Slow  Appearance/Hygiene Unremarkable  Behavior Characteristics Cooperative;Calm  Mood Anxious;Depressed  Aggressive Behavior  Effect No apparent injury  Thought Process  Coherency WDL  Content WDL  Delusions WDL  Perception WDL  Hallucination None reported or observed  Judgment WDL  Confusion None  Danger to Self  Current suicidal ideation? Passive  Danger to Others  Danger to Others None reported or observed

## 2022-03-13 NOTE — ED Notes (Signed)
Patient discharged with safe transport.

## 2022-03-13 NOTE — ED Provider Notes (Signed)
[facility] EMERGENCY DEPARTMENT Provider Note   CSN: [id number] Arrival date & time: 03/12/22  2251     History  No chief complaint on file.   Shirley Ruiz is a 36 y.o. female.  36 year old female presents to the emergency department for psychiatric admission.  No beds available at [facility].  She has been having worsening anxiety and depression with thoughts of self-harm.  She cut her thigh and left wrist yesterday.  Tetanus is up-to-date.  She is not having any active suicidal or homicidal ideations.  No AVH. No acute complaints at this time.  The history is provided by the patient. No language interpreter was used.       Home Medications Prior to Admission medications   Medication Sig Start Date End Date Taking? Authorizing Provider  clonazePAM (KLONOPIN) 0.5 MG tablet TAKE 1 TABLET BY MOUTH ONCE DAILY AS NEEDED FOR ANXIETY 02/27/22   Agapito Games, MD  albuterol (PROVENTIL) (2.5 MG/3ML) 0.083% nebulizer solution Take 3 mLs (2.5 mg total) by nebulization every 6 (six) hours as needed for wheezing or shortness of breath. 10/30/21   Agapito Games, MD  albuterol (VENTOLIN HFA) 108 (90 Base) MCG/ACT inhaler Inhale 2 puffs into the lungs every 6 (six) hours as needed for wheezing or shortness of breath. 10/30/21   Agapito Games, MD  Esomeprazole Magnesium (NEXIUM PO) Take by mouth.    [provider]  FLUoxetine (PROZAC) 10 MG capsule Take 3 capsules (30 mg total) by mouth daily. 14 DAY SUPPLY GIVEN. MUST SCHEDULE AND KEEP APPOINTMENT FOR REFILLS 10/30/21   Agapito Games, MD  montelukast (SINGULAIR) 10 MG tablet Take 1 tablet (10 mg total) by mouth at bedtime. 04/14/20   Agapito Games, MD  ondansetron (ZOFRAN) 8 MG tablet TAKE 1 TABLET BY MOUTH EVERY 8 HOURS AS NEEDED FOR NAUSEA 08/13/21   Agapito Games, MD  phentermine (ADIPEX-P) 37.5 MG tablet Take 37.5 mg by mouth 2 (two) times daily. 10/28/20   [provider]   sucralfate (CARAFATE) 1 g tablet Take 1 tablet (1 g total) by mouth 4 (four) times daily -  with meals and at bedtime. 12/14/21   Agapito Games, MD  SUMAtriptan (IMITREX) 100 MG tablet TAKE ONE TABLET BY MOUTH EVERY 2 HOURS AS NEEDED FOR MIGRAINE 08/14/21   Agapito Games, MD  valACYclovir (VALTREX) 500 MG tablet Take 1 tablet (500 mg total) by mouth daily. 10/30/21   Agapito Games, MD      Allergies    Bupropion and Penicillins    Review of Systems   Review of Systems Ten systems reviewed and are negative for acute change, except as noted in the HPI.    Physical Exam Updated Vital Signs There were no vitals taken for this visit.  Physical Exam Vitals and nursing note reviewed.  Constitutional:      General: She is not in acute distress.    Appearance: She is well-developed. She is not diaphoretic.  HENT:     Head: Normocephalic and atraumatic.  Eyes:     General: No scleral icterus.    Conjunctiva/sclera: Conjunctivae normal.  Pulmonary:     Effort: Pulmonary effort is normal. No respiratory distress.  Musculoskeletal:        General: Normal range of motion.     Cervical back: Normal range of motion.  Skin:    General: Skin is warm and dry.     Coloration: Skin is not pale.  Findings: No erythema or rash.  Neurological:     Mental Status: She is alert and oriented to person, place, and time.  Psychiatric:        Mood and Affect: Mood is depressed. Affect is flat.        Speech: Speech normal.        Behavior: Behavior normal. Behavior is cooperative.        Thought Content: Thought content does not include homicidal or suicidal ideation.     ED Results / Procedures / Treatments   Labs (all labs ordered are listed, but only abnormal results are displayed) Labs Reviewed  RAPID URINE DRUG SCREEN, HOSP PERFORMED - Abnormal; Notable for the following components:      Result Value   Tetrahydrocannabinol POSITIVE (*)    All other components  within normal limits  RESP PANEL BY RT-PCR (FLU A&B, COVID) ARPGX2  CBC WITH DIFFERENTIAL/PLATELET  COMPREHENSIVE METABOLIC PANEL  ETHANOL  I-STAT BETA HCG BLOOD, ED ([facility], [facility], [facility] ONLY)    EKG None  Radiology No results found.  Procedures Procedures    Medications Ordered in ED Medications  acetaminophen (TYLENOL) tablet 650 mg (has no administration in time range)  LORazepam (ATIVAN) tablet 1 mg (1 mg Oral Given 03/13/22 0045)    ED Course/ Medical Decision Making/ A&P                           Medical Decision Making Amount and/or Complexity of Data Reviewed Labs: ordered.  Risk OTC drugs. Prescription drug management.   Patient transferred to the emergency department from [facility] following psychiatric evaluation.  She has been recommended for inpatient psychiatric treatment and stabilization.  No beds available at [facility].  She has been medically cleared in the emergency department and is holding pending psych reassessment.  Care to be assumed by oncoming ED provider.        Final Clinical Impression(s) / ED Diagnoses Final diagnoses:  Depression, unspecified depression type  Self-injurious behavior    Rx / DC Orders ED Discharge Orders     None         Antony Madura, PA-C 03/13/22 1308    Marily Memos, MD 03/13/22 0600

## 2022-03-13 NOTE — Plan of Care (Signed)
  Problem: Education: Goal: Knowledge of  General Education information/materials will improve Outcome: Progressing Goal: Emotional status will improve Outcome: Progressing Goal: Mental status will improve Outcome: Progressing Goal: Verbalization of understanding the information provided will improve Outcome: Progressing   Problem: Activity: Goal: Interest or engagement in activities will improve Outcome: Progressing Goal: Sleeping patterns will improve Outcome: Progressing   Problem: Coping: Goal: Ability to verbalize frustrations and anger appropriately will improve Outcome: Progressing Goal: Ability to demonstrate self-control will improve Outcome: Progressing   Problem: Health Behavior/Discharge Planning: Goal: Identification of resources available to assist in meeting health care needs will improve Outcome: Progressing Goal: Compliance with treatment plan for underlying cause of condition will improve Outcome: Progressing   Problem: Physical Regulation: Goal: Ability to maintain clinical measurements within normal limits will improve Outcome: Progressing   Problem: Safety: Goal: Periods of time without injury will increase Outcome: Progressing   

## 2022-03-13 NOTE — ED Notes (Signed)
Pt dressed out, clothes placed in personal belonging bags. Pt gave her wallet and cell phone with family before dressing out.

## 2022-03-13 NOTE — Telephone Encounter (Signed)
Attempted to call Shirley Ruiz today.  It looks like she has been admitted.

## 2022-03-13 NOTE — Tx Team (Signed)
Initial Treatment Plan 03/13/2022 3:20 PM Shirley Ruiz VEL:[id number]    PATIENT STRESSORS: Marital or family conflict     PATIENT STRENGTHS: General fund of knowledge  Motivation for treatment/growth  Supportive family/friends    PATIENT IDENTIFIED PROBLEMS: Suicidal Ideation   Lack of coping skills related to self harm behaviors  Lack of coping skills r/t depression                 DISCHARGE CRITERIA:  Ability to meet basic life and health needs Adequate post-discharge living arrangements Verbal commitment to aftercare and medication compliance  PRELIMINARY DISCHARGE PLAN: Outpatient therapy Return to previous living arrangement Return to previous work or school arrangements  PATIENT/FAMILY INVOLVEMENT: This treatment plan has been presented to and reviewed with the patient, Shirley Ruiz, and/or family member, .  The patient and family have been given the opportunity to ask questions and make suggestions.  Virgel Paling, RN 03/13/2022, 3:20 PM

## 2022-03-13 NOTE — ED Notes (Signed)
Pt requesting another dose of Ativan. Pt reports taking klonopin at home for anxiety, but states "it hasn't been doing much." MD notified.

## 2022-03-14 ENCOUNTER — Encounter (HOSPITAL_COMMUNITY): Payer: Self-pay | Admitting: Nurse Practitioner

## 2022-03-14 DIAGNOSIS — F1210 Cannabis abuse, uncomplicated: Secondary | ICD-10-CM

## 2022-03-14 DIAGNOSIS — G4700 Insomnia, unspecified: Secondary | ICD-10-CM

## 2022-03-14 DIAGNOSIS — F332 Major depressive disorder, recurrent severe without psychotic features: Principal | ICD-10-CM

## 2022-03-14 MED ORDER — ALBUTEROL SULFATE HFA 108 (90 BASE) MCG/ACT IN AERS: 2.0000 | INHALATION_SPRAY | Freq: Four times a day (QID) | RESPIRATORY_TRACT | Status: DC | PRN

## 2022-03-14 MED ORDER — PANTOPRAZOLE SODIUM 40 MG PO TBEC
40.0000 mg | DELAYED_RELEASE_TABLET | Freq: Every day | ORAL | Status: DC | PRN
  Administered 2022-03-15 – 2022-03-17 (×3): 40 mg via ORAL
  Filled 2022-03-14 (×3): qty 1

## 2022-03-14 MED ORDER — ONDANSETRON 4 MG PO TBDP: 8.0000 mg | ORAL_TABLET | Freq: Three times a day (TID) | ORAL | Status: DC | PRN

## 2022-03-14 MED ORDER — ARIPIPRAZOLE 2 MG PO TABS
2.0000 mg | ORAL_TABLET | Freq: Every day | ORAL | Status: DC
  Administered 2022-03-14 – 2022-03-15 (×2): 2 mg via ORAL
  Filled 2022-03-14 (×5): qty 1

## 2022-03-14 MED ORDER — SUMATRIPTAN SUCCINATE 50 MG PO TABS: 50.0000 mg | ORAL_TABLET | ORAL | Status: DC | PRN

## 2022-03-14 MED ORDER — FLUOXETINE HCL 20 MG PO CAPS
40.0000 mg | ORAL_CAPSULE | Freq: Every day | ORAL | Status: DC
  Administered 2022-03-15 – 2022-03-18 (×4): 40 mg via ORAL
  Filled 2022-03-14 (×7): qty 2
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
## 2022-03-14 NOTE — [facility] Group Notes (Signed)
The focus of this group is to help patients review their daily goal of treatment and discuss progress on daily workbooks. Pt was attentive and appropriate during tonight's wrap up group. Pt stated that she was able to achieve goal of making it to a group today and being around peers. Overall today wasn't a bad day.

## 2022-03-14 NOTE — Plan of Care (Signed)
Nurse discussed anxiety, depression and coping skills with patient.  

## 2022-03-14 NOTE — [facility] Group Notes (Signed)
Adult Psychoeducational Group Note  Date:  03/14/2022 Time:  9:38 AM  Group Topic/Focus:  Goals Group:   The focus of this group is to help patients establish daily goals to achieve during treatment and discuss how the patient can incorporate goal setting into their daily lives to aide in recovery.  Participation Level:  Did Not Attend   Donell Beers 03/14/2022, 9:38 AM
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
## 2022-03-14 NOTE — Progress Notes (Addendum)
Pt stated she was feeling a little better   03/14/22 2200  Psych Admission Type (Psych Patients Only)  Admission Status Voluntary  Psychosocial Assessment  Patient Complaints Anxiety  Eye Contact Fair  Facial Expression Anxious  Affect Anxious  Speech Logical/coherent  Interaction Assertive  Motor Activity Slow  Appearance/Hygiene Unremarkable  Behavior Characteristics Cooperative  Mood Depressed  Aggressive Behavior  Effect No apparent injury  Thought Process  Coherency WDL  Content WDL  Delusions WDL  Perception WDL  Hallucination None reported or observed  Judgment WDL  Confusion None  Danger to Self  Current suicidal ideation? Denies  Danger to Others  Danger to Others None reported or observed

## 2022-03-14 NOTE — Progress Notes (Addendum)
D:  Patient's self inventory sheet, patient  has fair sleep, sleep medication helpful.  Low energy, poor concentration.  Rated depression 7, hopeless 5, anxiety 8.  Denied withdrawals.  Denied SI.  Physical problems, headaches.  Physical pain, back and hips, worst pain #3 in past 24 hours.  Goal is deal with depression, anxiety, change meds.  Will talk to MD, sleep, stay busy.  No discharge plans. A:  Medications administered per MD orders.  Emotional support and encouragement given patient. R:  Denied SI and HI, contracts for safety.  Denied A/V hallucinations.  Safety maintained with 15 minute checks.

## 2022-03-15 ENCOUNTER — Encounter (HOSPITAL_COMMUNITY): Payer: Self-pay

## 2022-03-15 LAB — HEMOGLOBIN A1C
Hgb A1c MFr Bld: 4.8 % (ref 4.8–5.6)
Mean Plasma Glucose: 91.06 mg/dL

## 2022-03-15 LAB — LIPID PANEL
Cholesterol: 178 mg/dL (ref 0–200)
HDL: 51 mg/dL (ref >40)
LDL Cholesterol: 109 mg/dL — ABNORMAL HIGH (ref 0–99)
Total CHOL/HDL Ratio: 3.5 RATIO
Triglycerides: 89 mg/dL (ref <150)
VLDL: 18 mg/dL (ref 0–40)

## 2022-03-15 MED ORDER — ARIPIPRAZOLE 5 MG PO TABS
5.0000 mg | ORAL_TABLET | Freq: Every day | ORAL | Status: DC
  Administered 2022-03-16 – 2022-03-18 (×3): 5 mg via ORAL
  Filled 2022-03-15 (×5): qty 1

## 2022-03-15 MED ORDER — TRAZODONE HCL 100 MG PO TABS
100.0000 mg | ORAL_TABLET | Freq: Every evening | ORAL | Status: DC | PRN
  Administered 2022-03-15 – 2022-03-17 (×3): 100 mg via ORAL
  Filled 2022-03-15 (×3): qty 1

## 2022-03-15 NOTE — Group Note (Signed)
Date:  03/15/2022 Time:  9:55 AM  Group Topic/Focus:  Orientation:   The focus of this group is to educate the patient on the purpose and policies of crisis stabilization and provide a format to answer questions about their admission.  The group details unit policies and expectations of patients while admitted.    Participation Level:  Active  Participation Quality:  Appropriate  Affect:  Appropriate  Cognitive:  Appropriate  Insight: Appropriate  Engagement in Group:  Engaged  Modes of Intervention:  Discussion  Additional Comments:     Reymundo Poll 03/15/2022, 9:55 AM

## 2022-03-15 NOTE — BH IP Treatment Plan (Signed)
Interdisciplinary Treatment and Diagnostic Plan Update  03/15/2022 Time of Session: 0830 Shirley Ruiz MRN: [id number]  Principal Diagnosis: MDD (major depressive disorder), recurrent severe, without psychosis (HCC)  Secondary Diagnoses: Principal Problem:   MDD (major depressive disorder), recurrent severe, without psychosis (HCC) Active Problems:   Suicidal ideation   Cannabis abuse   Insomnia   Current Medications:  Current Facility-Administered Medications  Medication Dose Route Frequency Provider Last Rate Last Admin   acetaminophen (TYLENOL) tablet 650 mg  650 mg Oral Q6H PRN Attiah, Nadir, MD       albuterol (VENTOLIN HFA) 108 (90 Base) MCG/ACT inhaler 2 puff  2 puff Inhalation Q6H PRN Christia Reading, MD       alum & mag hydroxide-simeth (MAALOX/MYLANTA) 200-200-20 MG/5ML suspension 30 mL  30 mL Oral Q4H PRN Eligha Bridegroom, NP   30 mL at 03/15/22 0843   [START ON 03/16/2022] ARIPiprazole (ABILIFY) tablet 5 mg  5 mg Oral Daily Christia Reading, MD       FLUoxetine (PROZAC) capsule 40 mg  40 mg Oral Daily Christia Reading, MD   40 mg at 03/15/22 0743   hydrOXYzine (ATARAX) tablet 25 mg  25 mg Oral TID PRN Eligha Bridegroom, NP   25 mg at 03/14/22 1705   magnesium hydroxide (MILK OF MAGNESIA) suspension 30 mL  30 mL Oral Daily PRN Eligha Bridegroom, NP       ondansetron (ZOFRAN-ODT) disintegrating tablet 8 mg  8 mg Oral Q8H PRN Christia Reading, MD       pantoprazole (PROTONIX) EC tablet 40 mg  40 mg Oral Daily PRN Christia Reading, MD   40 mg at 03/15/22 0843   SUMAtriptan (IMITREX) tablet 50 mg  50 mg Oral Q2H PRN Christia Reading, MD       traZODone (DESYREL) tablet 100 mg  100 mg Oral QHS PRN Christia Reading, MD       PTA Medications: Medications Prior to Admission  Medication Sig Dispense Refill Last Dose   clonazePAM (KLONOPIN) 0.5 MG tablet TAKE 1 TABLET BY MOUTH ONCE DAILY AS NEEDED FOR ANXIETY 20 tablet 0    esomeprazole (NEXIUM) 20 MG capsule Take 20 mg by mouth daily.       fexofenadine (ALLEGRA) 60 MG tablet Take 60 mg by mouth 2 (two) times daily.      albuterol (VENTOLIN HFA) 108 (90 Base) MCG/ACT inhaler Inhale 2 puffs into the lungs every 6 (six) hours as needed for wheezing or shortness of breath. 18 g 1    FLUoxetine (PROZAC) 10 MG capsule Take 3 capsules (30 mg total) by mouth daily. 14 DAY SUPPLY GIVEN. MUST SCHEDULE AND KEEP APPOINTMENT FOR REFILLS (Patient taking differently: Take 30 mg by mouth in the morning, at noon, and at bedtime. 14 DAY SUPPLY GIVEN. MUST SCHEDULE AND KEEP APPOINTMENT FOR REFILLS) 270 capsule 3    ondansetron (ZOFRAN) 8 MG tablet TAKE 1 TABLET BY MOUTH EVERY 8 HOURS AS NEEDED FOR NAUSEA 12 tablet 0    SUMAtriptan (IMITREX) 100 MG tablet TAKE ONE TABLET BY MOUTH EVERY 2 HOURS AS NEEDED FOR MIGRAINE 10 tablet 0    valACYclovir (VALTREX) 500 MG tablet Take 1 tablet (500 mg total) by mouth daily. 90 tablet 3     Patient Stressors: Marital or family conflict    Patient Strengths: Automotive engineer for treatment/growth  Supportive family/friends   Treatment Modalities: Medication Management, Group therapy, Case management,  1 to 1 session with clinician, Psychoeducation, Recreational therapy.   Physician  Treatment Plan for Primary Diagnosis: MDD (major depressive disorder), recurrent severe, without psychosis (HCC) Long Term Goal(s): Improvement in symptoms so as ready for discharge   Short Term Goals: Ability to identify changes in lifestyle to reduce recurrence of condition will improve Ability to verbalize feelings will improve Ability to disclose and discuss suicidal ideas Ability to demonstrate self-control will improve Ability to identify and develop effective coping behaviors will improve Ability to maintain clinical measurements within normal limits will improve Compliance with prescribed medications will improve Ability to identify triggers associated with substance abuse/mental health issues will  improve  Medication Management: Evaluate patient's response, side effects, and tolerance of medication regimen.  Therapeutic Interventions: 1 to 1 sessions, Unit Group sessions and Medication administration.  Evaluation of Outcomes: Progressing  Physician Treatment Plan for Secondary Diagnosis: Principal Problem:   MDD (major depressive disorder), recurrent severe, without psychosis (HCC) Active Problems:   Suicidal ideation   Cannabis abuse   Insomnia  Long Term Goal(s): Improvement in symptoms so as ready for discharge   Short Term Goals: Ability to identify changes in lifestyle to reduce recurrence of condition will improve Ability to verbalize feelings will improve Ability to disclose and discuss suicidal ideas Ability to demonstrate self-control will improve Ability to identify and develop effective coping behaviors will improve Ability to maintain clinical measurements within normal limits will improve Compliance with prescribed medications will improve Ability to identify triggers associated with substance abuse/mental health issues will improve     Medication Management: Evaluate patient's response, side effects, and tolerance of medication regimen.  Therapeutic Interventions: 1 to 1 sessions, Unit Group sessions and Medication administration.  Evaluation of Outcomes: Progressing   RN Treatment Plan for Primary Diagnosis: MDD (major depressive disorder), recurrent severe, without psychosis (HCC) Long Term Goal(s): Knowledge of disease and therapeutic regimen to maintain health will improve  Short Term Goals: Ability to remain free from injury will improve, Ability to verbalize frustration and anger appropriately will improve, Ability to demonstrate self-control, Ability to participate in decision making will improve, Ability to verbalize feelings will improve, Ability to disclose and discuss suicidal ideas, Ability to identify and develop effective coping behaviors will  improve, and Compliance with prescribed medications will improve  Medication Management: RN will administer medications as ordered by provider, will assess and evaluate patient's response and provide education to patient for prescribed medication. RN will report any adverse and/or side effects to prescribing provider.  Therapeutic Interventions: 1 on 1 counseling sessions, Psychoeducation, Medication administration, Evaluate responses to treatment, Monitor vital signs and CBGs as ordered, Perform/monitor CIWA, COWS, AIMS and Fall Risk screenings as ordered, Perform wound care treatments as ordered.  Evaluation of Outcomes: Progressing   LCSW Treatment Plan for Primary Diagnosis: MDD (major depressive disorder), recurrent severe, without psychosis (HCC) Long Term Goal(s): Safe transition to appropriate next level of care at discharge, Engage patient in therapeutic group addressing interpersonal concerns.  Short Term Goals: Engage patient in aftercare planning with referrals and resources, Increase social support, Increase ability to appropriately verbalize feelings, Increase emotional regulation, Facilitate acceptance of mental health diagnosis and concerns, Facilitate patient progression through stages of change regarding substance use diagnoses and concerns, and Identify triggers associated with mental health/substance abuse issues  Therapeutic Interventions: Assess for all discharge needs, 1 to 1 time with Social worker, Explore available resources and support systems, Assess for adequacy in community support network, Educate family and significant other(s) on suicide prevention, Complete Psychosocial Assessment, Interpersonal group therapy.  Evaluation of Outcomes: Progressing  Progress in Treatment: Attending groups: Yes. Participating in groups: Yes. Taking medication as prescribed: Yes. Toleration medication: Yes. Family/Significant other contact made: No, will contact:  Patient has  declined to provide consent to reach collateral.  Patient understands diagnosis: Yes. Discussing patient identified problems/goals with staff: Yes. Medical problems stabilized or resolved: Yes. Denies suicidal/homicidal ideation: No. Issues/concerns per patient self-inventory: Yes. Other: none  New problem(s) identified: No, Describe:  none  New Short Term/Long Term Goal(s): Patient to work towards medication management for mood stabilization; elimination of SI thoughts; development of comprehensive mental wellness plan.  Patient Goals:  Patient states their goal for treatment is to "better cope for times of extreme stress and get my meds under control."  Discharge Plan or Barriers: No psychosocial barriers identified at this time, patient to return to place of residence when appropriate for discharge.   Reason for Continuation of Hospitalization: Depression  Estimated Length of Stay: 1-7 days    Last 3 [place] Suicide Severity Risk Score: Flowsheet Row [facility] (Current) [facility] Most recent reading at 03/13/2022  3:05 PM ED from 03/12/2022 in [facility] Most recent reading at 03/12/2022 11:26 PM [facility] Most recent reading at 03/12/2022  8:37 PM  C-SSRS RISK CATEGORY Low Risk High Risk Low Risk       Last PHQ 2/9 Scores:    12/14/2021    3:24 PM 10/30/2021   10:19 AM 12/05/2020    3:11 PM  Depression screen PHQ 2/9  Decreased Interest 3 1 3   Down, Depressed, Hopeless 2 1 3   PHQ - 2 Score 5 2 6   Altered sleeping 3 2 3   Tired, decreased energy 3 2 3   Change in appetite 1 1 3   Feeling bad or failure about yourself  2 0 3  Trouble concentrating 1 1 2   Moving slowly or fidgety/restless 1 1 1   Suicidal thoughts 0 0 1  PHQ-9 Score 16 9 22   Difficult doing work/chores Very difficult Somewhat difficult Extremely dIfficult     Scribe for Treatment Team: 03/15/2022 11:43 AM

## 2022-03-15 NOTE — Progress Notes (Signed)
Patient appears pleasant. Patient denies SI/HI/AVH. Pt reports poor sleep Patient complied with morning medication with no reported side effects. Pt has been participating in the milieu. Patient remains safe on Q58min checks and contracts for safety.       03/15/22 1028  Psych Admission Type (Psych Patients Only)  Admission Status Voluntary  Psychosocial Assessment  Patient Complaints Anxiety;Sleep disturbance  Eye Contact Fair  Facial Expression Anxious  Affect Anxious  Speech Logical/coherent  Interaction Assertive  Motor Activity Slow  Appearance/Hygiene Unremarkable  Behavior Characteristics Cooperative;Anxious  Mood Depressed;Anxious  Thought Process  Coherency WDL  Content WDL  Delusions None reported or observed  Perception WDL  Hallucination None reported or observed  Judgment Impaired  Confusion None  Danger to Self  Current suicidal ideation? Denies  Self-Injurious Behavior No self-injurious ideation or behavior indicators observed or expressed   Agreement Not to Harm Self Yes  Description of Agreement verbal  Danger to Others  Danger to Others None reported or observed

## 2022-03-15 NOTE — Group Note (Signed)
[facility] LCSW Group Therapy Note   Group Date: 03/15/2022 Start Time: 1300 End Time: 1400   Type of Therapy and Topic: Group Therapy: Avoiding Self-Sabotaging and Enabling Behaviors  Participation Level: Active  Description of Group:  In this group, patients will learn how to identify obstacles, self-sabotaging and enabling behaviors, as well as: what are they, why do we do them and what needs these behaviors meet. Discuss unhealthy relationships and how to have positive healthy boundaries with those that sabotage and enable. Explore aspects of self-sabotage and enabling in yourself and how to limit these self-destructive behaviors in everyday life.   Therapeutic Goals: 1. Patient will identify one obstacle that relates to self-sabotage and enabling behaviors 2. Patient will identify one personal self-sabotaging or enabling behavior they did prior to admission 3. Patient will state a plan to change the above identified behavior 4. Patient will demonstrate ability to communicate their needs through discussion and/or role play.    Summary of Patient Progress: Patient was present for the entirety of the group session. Patient was an active listener and participated in the topic of discussion, provided helpful advice to others, and added nuance to topic of conversation.    Therapeutic Modalities:  Cognitive Behavioral Therapy Person-Centered Therapy Motivational Interviewing    Bristol Soy W Lashunta Frieden, LCSWA 

## 2022-03-15 NOTE — Progress Notes (Signed)
[facility] MD Progress Note  03/15/2022 8:57 AM Martine Bleecker  MRN:  [id number]   Reason for Admission:  Shirley Ruiz is a 36 y.o. female with a history of MDD, GAD, and PTSD, who was initially admitted for inpatient psychiatric hospitalization on 03/13/2022 for management of suicidal ideation and self-harm with cutting wrists and thighs. The patient is currently on Hospital Day 2.   Chart Review from last 24 hours:  The patient's chart was reviewed and nursing notes were reviewed. The patient's case was discussed in multidisciplinary team meeting. Patient was attentive and appropriate during the wrap up group and was around peers. Per [facility], patient is taking medications appropriately. The following PRN medications were given: atarax 1x, trazodone 1x.   Information Obtained Today During Patient Interview: The patient was seen and evaluated on the unit. On assessment today the patient reports feeling "good". She reports difficulty sleeping, reports sleeping 4 hours, and is agreeable to increasing the trazodone dose. Notes good appetite. She reports feeling anxious yesterday because of wondering if a family member will visit her in the hospital. Denied having a bowel movement and said she will try medication if she continues not producing a bowel movement today. Denies lessening feelings of low mood. Currently denies SI, HI, AVH. She reports the most recent time that she thought about self-harm was yesterday morning. Discussed gun safety and she is agreeable for her friend Georgiann Hahn to remove the gun from the house. Will confirm with her sister of the gun removal as she will visit her apartment. Her goal today is to attend the group sessions.    Principal Problem: MDD (major depressive disorder), recurrent severe, without psychosis (HCC) Diagnosis: Principal Problem:   MDD (major depressive disorder), recurrent severe, without psychosis (HCC) Active Problems:   Suicidal ideation   Cannabis abuse    Insomnia    Past Psychiatric History: See H&P  Past Medical History:  Past Medical History:  Diagnosis Date   Abnormal Pap smear 01/2009   .HGSIL, Colpo and Cryo   Anxiety    Asthma    Depression    Diabetes in pregnancy    Gestational   HSV-1 (herpes simplex virus 1) infection    HSV-2 (herpes simplex virus 2) infection    Intervertebral disc protrusion    Mental disorder    H/O depression   Migraines    PVC (premature ventricular contraction)     Past Surgical History:  Procedure Laterality Date   CRYOTHERAPY     For cervical dysplasia   MOUTH SURGERY     Cut in mouth to remove an extra tooth   Family History:  Family History  Problem Relation Age of Onset   Depression Mother    Hypertension Mother    Heart disease Mother    Heart attack Father    Healthy Sister    Depression Maternal Grandmother    Heart disease Maternal Grandfather    Family Psychiatric  History: See H&P Social History: See H&P  Sleep:  Poor  Appetite:  Good  Current Medications: Current Facility-Administered Medications  Medication Dose Route Frequency Provider Last Rate Last Admin   acetaminophen (TYLENOL) tablet 650 mg  650 mg Oral Q6H PRN Attiah, Nadir, MD       albuterol (VENTOLIN HFA) 108 (90 Base) MCG/ACT inhaler 2 puff  2 puff Inhalation Q6H PRN Christia Reading, MD       alum & mag hydroxide-simeth (MAALOX/MYLANTA) 200-200-20 MG/5ML suspension 30 mL  30 mL Oral Q4H PRN Effie Shy,  Mikaela, NP   30 mL at 03/15/22 0843   [START ON 03/16/2022] ARIPiprazole (ABILIFY) tablet 5 mg  5 mg Oral Daily Christia Reading, MD       FLUoxetine (PROZAC) capsule 40 mg  40 mg Oral Daily Christia Reading, MD   40 mg at 03/15/22 0743   hydrOXYzine (ATARAX) tablet 25 mg  25 mg Oral TID PRN Eligha Bridegroom, NP   25 mg at 03/14/22 1705   magnesium hydroxide (MILK OF MAGNESIA) suspension 30 mL  30 mL Oral Daily PRN Eligha Bridegroom, NP       ondansetron (ZOFRAN-ODT) disintegrating tablet 8 mg  8 mg Oral Q8H PRN  Christia Reading, MD       pantoprazole (PROTONIX) EC tablet 40 mg  40 mg Oral Daily PRN Christia Reading, MD   40 mg at 03/15/22 0843   SUMAtriptan (IMITREX) tablet 50 mg  50 mg Oral Q2H PRN Christia Reading, MD       traZODone (DESYREL) tablet 100 mg  100 mg Oral QHS PRN Christia Reading, MD        Lab Results:  Results for orders placed or performed during the hospital encounter of 03/13/22 (from the past 48 hour(s))  Lipid panel     Status: Abnormal   Collection Time: 03/15/22  6:56 AM  Result Value Ref Range   Cholesterol 178 0 - 200 mg/dL   Triglycerides 89 <233 mg/dL   HDL 51 >00 mg/dL   Total CHOL/HDL Ratio 3.5 RATIO   VLDL 18 0 - 40 mg/dL   LDL Cholesterol 762 (H) 0 - 99 mg/dL    Comment:        Total Cholesterol/HDL:CHD Risk Coronary Heart Disease Risk Table                     Men   Women  1/2 Average Risk   3.4   3.3  Average Risk       5.0   4.4  2 X Average Risk   9.6   7.1  3 X Average Risk  23.4   11.0        Use the calculated Patient Ratio above and the CHD Risk Table to determine the patient's CHD Risk.        ATP III CLASSIFICATION (LDL):  <100     mg/dL   Optimal  263-335  mg/dL   Near or Above                    Optimal  130-159  mg/dL   Borderline  456-256  mg/dL   High  >389     mg/dL   Very High Performed at [facility], [address], [place], [place] [address]     Blood Alcohol level:  Lab Results  Component Value Date   ETH <10 03/13/2022    Metabolic Disorder Labs: No results found for: "HGBA1C", "MPG" No results found for: "PROLACTIN" Lab Results  Component Value Date   CHOL 178 03/15/2022   TRIG 89 03/15/2022   HDL 51 03/15/2022   CHOLHDL 3.5 03/15/2022   VLDL 18 03/15/2022   LDLCALC 109 (H) 03/15/2022   LDLCALC 76 10/30/2021     Musculoskeletal: Strength & Muscle Tone: within normal limits Gait & Station: normal Patient leans: N/A  Psychiatric Specialty Exam:  General Appearance: appears at stated age,  fairly dressed and groomed  Behavior: guarded  Psychomotor Activity:No psychomotor agitation or retardation noted  Eye Contact: fair  Speech: normal  Mood: euthymic Affect: congruent, interactive   Thought Process: linear  Descriptions of Associations: intact  Thought Content: No bizarre thoughts, logical Hallucinations: Denies AH, VH  Delusions: Denies Paranoia  Suicidal Thoughts: Denies SI, intention, plan  Homicidal Thoughts: Denies HI, intention, plan   Alertness/Orientation: A&O x 3  Insight: fair- understands that cutting self is a poor coping mechanism and wants to continue working on better internal coping skills  Judgment: Fair   Memory: intact  Executive Functions  Concentration: intact  Attention Span: Fair Recall: intact Fund of Knowledge: fair   Assets  Assets:Communication Skills; Desire for Improvement; Physical Health; Resilience; Social Support    Physical Exam: Physical Exam Constitutional:      Appearance: Normal appearance.  Cardiovascular:     Rate and Rhythm: Normal rate.  Pulmonary:     Effort: Pulmonary effort is normal.  Neurological:     General: No focal deficit present.     Mental Status: She is alert and oriented to person, place, and time.  Psychiatric:        Thought Content: Thought content normal.    Review of Systems  Constitutional:  Negative for chills, fever and weight loss.  Gastrointestinal:  Positive for constipation. Negative for diarrhea, nausea and vomiting.  Neurological:  Negative for tingling.  Psychiatric/Behavioral:  Negative for hallucinations and suicidal ideas. The patient is nervous/anxious and has insomnia.    Blood pressure 110/80, pulse 88, temperature 98 F (36.7 C), temperature source Oral, resp. rate 20, height 5\' 2"  (1.575 m), weight 80.2 kg, SpO2 100 %. Body mass index is 32.34 kg/m.   Treatment Plan Summary: Daily contact with patient to assess and  evaluate symptoms and progress in treatment and Medication management  ASSESSMENT: Patient's symptoms of SI is lessening. Will increase Abilify and monitor for tolerability and side effects. Increasing the trazodone for difficulty sleeping.   Diagnoses / Active Problems: Principal Problem: MDD (major depressive disorder), recurrent severe, without psychosis (HCC) Diagnosis: Principal Problem:   MDD (major depressive disorder), recurrent severe, without psychosis (HCC) Active Problems:   Suicidal ideation   Cannabis abuse   Insomnia   PLAN: Safety and Monitoring:  -- Voluntary admission to inpatient psychiatric unit for safety, stabilization and treatment  -- Daily contact with patient to assess and evaluate symptoms and progress in treatment  -- Patient's case to be discussed in multi-disciplinary team meeting  -- Observation Level : q15 minute checks  -- Vital signs:  q12 hours  -- Precautions: suicide, elopement, and assault  2. Medications:   -- Increase Abilify to 5 mg for augmenting for MDD    -- Metabolic profile and EKG monitoring obtained while on an atypical antipsychotic (BMI: 32.34 Lipid Panel: LDL 109 HbgA1c: 4.8 QTc:442 on 8/10)   -- Increase Trazodone to 100 mg QHS PRN for insomnia  -- Continue Prozac 40 mg for MDD  -- Continue Atarax 25 mg TID PRN for anxiety   -- Monitor for signs of serotonin syndrome --  The risks/benefits/side-effects/alternatives to this medication were discussed in detail with the patient and time was given for questions. The patient consents to medication trial.               -- Restart Albuterol 108 mcg inhaler 2 puffs Q6H PRN for asthma             -- Restart Protonix 40 mg daily PRN for IBS             --  Restart Zofran 8 mg Q8H PRN for migraine             -- Restart Imitrex 50 mg PRN for migrain             3. Pertinent labs:   CBC WNL CMP WNL UDS Positive for THC EKG unremarkable Lipid panel: LDL 109 (mildly elevated- can be  followed up with PCP upon d/c) A1c WNL  #Elevated LDL -- exercise and diet appropriately       4. Cannabis Use Disorder  -- THC vape cessation encouraged  5. Group and Therapy: -- Encouraged patient to participate in unit milieu and in scheduled group therapies      -- Short Term Goals: Ability to identify changes in lifestyle to reduce recurrence of condition will improve, Ability to verbalize feelings will improve, Ability to disclose and discuss suicidal ideas, Ability to demonstrate self-control will improve, Ability to identify and develop effective coping behaviors will improve, Ability to maintain clinical measurements within normal limits will improve, Compliance with prescribed medications will improve, and Ability to identify triggers associated with substance abuse/mental health issues will improve  -- Long Term Goals: Improvement in symptoms so as ready for discharge  6. Discharge Planning:   -- Social work and case management to assist with discharge planning and identification of hospital follow-up needs prior to discharge  -- Estimated LOS: 5-7 days  -- Discharge Concerns: Need to establish a safety plan; Medication compliance and effectiveness  -- Discharge Goals: Return home with outpatient referrals for mental health follow-up including medication management/psychotherapy      Total Time Spent in Direct Patient Care:  I personally spent 30 minutes on the unit in direct patient care. The direct patient care time included face-to-face time with the patient, reviewing the patient's chart, communicating with other professionals, and coordinating care. Greater than 50% of this time was spent in counseling or coordinating care with the patient regarding goals of hospitalization, psycho-education, and discharge planning needs.   Christia Reading, MD 03/15/2022, 8:57 AM

## 2022-03-15 NOTE — Plan of Care (Signed)
  Problem: Education: Goal: Knowledge of [facility] Education information/materials will improve Outcome: Progressing Goal: Emotional status will improve Outcome: Progressing Goal: Mental status will improve Outcome: Progressing Goal: Verbalization of understanding the information provided will improve Outcome: Progressing   Problem: Activity: Goal: Interest or engagement in activities will improve Outcome: Progressing Goal: Sleeping patterns will improve Outcome: Progressing   Problem: Coping: Goal: Ability to verbalize frustrations and anger appropriately will improve Outcome: Progressing Goal: Ability to demonstrate self-control will improve Outcome: Progressing   Problem: Health Behavior/Discharge Planning: Goal: Identification of resources available to assist in meeting health care needs will improve Outcome: Progressing Goal: Compliance with treatment plan for underlying cause of condition will improve Outcome: Progressing   Problem: Physical Regulation: Goal: Ability to maintain clinical measurements within normal limits will improve Outcome: Progressing   Problem: Safety: Goal: Periods of time without injury will increase Outcome: Progressing   

## 2022-03-15 NOTE — Progress Notes (Signed)
   03/15/22 2000  Psych Admission Type (Psych Patients Only)  Admission Status Voluntary  Psychosocial Assessment  Patient Complaints Anxiety  Eye Contact Fair  Facial Expression Anxious  Affect Anxious  Speech Logical/coherent  Interaction Assertive  Motor Activity Slow  Appearance/Hygiene Unremarkable  Behavior Characteristics Cooperative  Mood Depressed  Aggressive Behavior  Effect No apparent injury  Thought Process  Coherency WDL  Content WDL  Delusions WDL  Perception WDL  Hallucination None reported or observed  Judgment WDL  Confusion None  Danger to Self  Current suicidal ideation? Denies  Danger to Others  Danger to Others None reported or observed

## 2022-03-15 NOTE — [facility] Group Notes (Signed)
Adult Psychoeducational Group Note  Date:  03/15/2022 Time:  2:28 PM  Group Topic/Focus:  Managing Feelings:   The focus of this group is to identify what feelings patients have difficulty handling and develop a plan to handle them in a healthier way upon discharge.  Participation Level:  Active  Participation Quality:  Attentive  Affect:  Appropriate  Cognitive:  Alert  Insight: Appropriate  Engagement in Group:  Engaged  Modes of Intervention:  Activity  Additional Comments:  Patient attended and participated in the emotional regulation group activity.  [name] 03/15/2022, 2:28 PM

## 2022-03-16 NOTE — Progress Notes (Signed)
D. Pt presents as anxious, but has been calm, and cooperative on the unit- observed interacting well with peers and staff, and observed attending groups. Per pt's self inventory, pt rated her depression, hopelessness and anxiety a 3/2/3, respectively. Pt currently denies SI/HI and AVH A. Labs and vitals monitored. Pt given and educated on medications. Pt supported emotionally and encouraged to express concerns and ask questions.   R. Pt remains safe with 15 minute checks. Will continue POC.

## 2022-03-16 NOTE — Group Note (Signed)
LCSW Group Therapy Note No social work group was held today due to newly separated halls necessitating a higher number of groups, a significant number of expected and unexpected discharges, a number of necessary Suicide Prevention Education calls to family members, and a high number of admissions that required initial psychosocial assessments.  The following was provided to each patient on 300 and 500 halls in lieu of in-person group:  Healthy vs. Unhealthy Coping Skills and Supports   Unhealthy Qualities                                             Healthy Qualities Works (at first) Works   Stops working or starts hurting Continues working  Fast Usually takes time to develop  Easy Often difficult to learn  Usually a habit Usually unknown, has to become a habit  Can do alone Often need to reach out for help   Leads to loss Leads to gain         My Unhealthy Coping Skills                                    My Healthy Coping Skills                       My Unhealthy Supports                                           My Healthy Supports                         Celisa Schoenberg Grossman-Orr, LCSW 03/16/2022  2:43 PM     

## 2022-03-16 NOTE — Group Note (Signed)
Date:  03/16/2022 Time:  9:25 AM  Group Topic/Focus:  Goals Group:   The focus of this group is to help patients establish daily goals to achieve during treatment and discuss how the patient can incorporate goal setting into their daily lives to aide in recovery. Orientation:   The focus of this group is to educate the patient on the purpose and policies of crisis stabilization and provide a format to answer questions about their admission.  The group details unit policies and expectations of patients while admitted.    Participation Level:  Did Not Attend  Participation Quality:    Affect:    Cognitive:    Insight:   Engagement in Group:    Modes of Intervention:    Additional Comments:    Jaquita Rector 03/16/2022, 9:25 AM

## 2022-03-16 NOTE — [facility] Group Notes (Signed)
Goals Group 78/2023   Group Focus: affirmation, clarity of thought, and goals/reality orientation Treatment Modality:  Psychoeducation Interventions utilized were assignment, group exercise, and support Purpose: To be able to understand and verbalize the reason for their admission to the hospital. To understand that the medication helps with their chemical imbalance but they also need to work on their choices in life. To be challenged to develop a list of 30 positives about themselves. Also introduce the concept that "feelings" are not reality.  Participation Level:  Active  Participation Quality:  Appropriate  Affect:  Appropriate  Cognitive:  Appropriate  Insight:  Improving  Engagement in Group:  Engaged  Additional Comments:  Rates her energy at a 9.5/10. Participated fully in the group.  Dione Housekeeper

## 2022-03-16 NOTE — [facility] Group Notes (Signed)
Psychoeducational Group Note    Date:  03/16/2022 Time: 1300-1400    Purpose of Group: . The group focus' on teaching patients on how to identify their needs and their Life Skills:  A group where two lists are made. What people need and what are things that we do that are unhealthy. The lists are developed by the patients and it is explained that we often do the actions that are not healthy to get our list of needs met.  Goal:: to develop the coping skills needed to get their needs met  Participation Level:  Active  Participation Quality:  Appropriate  Affect:  Appropriate  Cognitive:  Oriented  Insight: Improving  Engagement in Group:  Engaged  Modes of Intervention:  Activity, Discussion, Education, and Support  Additional Comments:  Rates energy at a 9.5. Participated in the group.  Paulino Rily

## 2022-03-16 NOTE — Progress Notes (Signed)
Adult Psychoeducational Group Note  Date:  03/16/2022 Time:  8:35 PM  Group Topic/Focus:  Wrap-Up Group:   The focus of this group is to help patients review their daily goal of treatment and discuss progress on daily workbooks.  Participation Level:  Active  Participation Quality:  Appropriate  Affect:  Appropriate  Cognitive:  Appropriate  Insight: Appropriate  Engagement in Group:  Engaged  Modes of Intervention:  Education and Exploration  Additional Comments:  Patient attended and participated in group tonight. She reports that she has learn how to identify her true needs while she has been here.  Lita Mains [facility] 03/16/2022, 8:35 PM

## 2022-03-16 NOTE — Progress Notes (Signed)
[facility] MD Progress Note  03/16/2022 11:22 AM Shirley Ruiz  MRN:  [id number]   Reason for Admission:  Shirley Ruiz is a 36 y.o. female with a history of MDD, GAD, and PTSD, who was initially admitted for inpatient psychiatric hospitalization on 03/13/2022 for management of suicidal ideation and self-harm with cutting wrists and thighs. The patient is currently on Hospital Day 3.   Chart Review from last 24 hours:  The patient's chart was reviewed and nursing notes were reviewed. The patient's case was discussed in multidisciplinary team meeting.  Patient was reported to be attending groups and participating in the milieu appropriately with no issues noted.  She is using Atarax as needed for anxiety average once daily with good efficacy reported and uses trazodone at bedtime for sleep last night.   Information Obtained Today During Patient Interview: The patient was seen and evaluated on the unit. On assessment today the patient reports fair sleep last night but was interrupted by staff otherwise would have had good sleep, reports "better" mood with improved depression and anxiety since admission.  Denies passive SI wishing self dead last time prior to admission at least 2 days ago, denies active SI intention or plan, denies HI or AVH.  She has been attending groups and able to discuss coping skills with stressors.  She denies symptoms consistent with mania or hypomania.  She had a bowel movement yesterday and denies any side effect of medication since started. She is able to discuss stressors led to worsening mood prior to admission and notes she has primary custody over her daughter and after discharge she plans to speak with a lawyer to formulate a plan to proceed, she seems to be able to enforce linear planning to address her social stressors, notes having good family and friend support outside the hospital. Counseled patient again today regarding need to abstain from marijuana after  discharge.  Principal Problem: MDD (major depressive disorder), recurrent severe, without psychosis (HCC) Diagnosis: Principal Problem:   MDD (major depressive disorder), recurrent severe, without psychosis (HCC) Active Problems:   Suicidal ideation   Cannabis abuse   Insomnia    Past Psychiatric History: See H&P  Past Medical History:  Past Medical History:  Diagnosis Date   Abnormal Pap smear 01/2009   .HGSIL, Colpo and Cryo   Anxiety    Asthma    Depression    Diabetes in pregnancy    Gestational   HSV-1 (herpes simplex virus 1) infection    HSV-2 (herpes simplex virus 2) infection    Intervertebral disc protrusion    Mental disorder    H/O depression   Migraines    PVC (premature ventricular contraction)     Past Surgical History:  Procedure Laterality Date   CRYOTHERAPY     For cervical dysplasia   MOUTH SURGERY     Cut in mouth to remove an extra tooth   Family History:  Family History  Problem Relation Age of Onset   Depression Mother    Hypertension Mother    Heart disease Mother    Heart attack Father    Healthy Sister    Depression Maternal Grandmother    Heart disease Maternal Grandfather    Family Psychiatric  History: See H&P Social History: See H&P  Appetite:  Good  Current Medications: Current Facility-Administered Medications  Medication Dose Route Frequency Provider Last Rate Last Admin   acetaminophen (TYLENOL) tablet 650 mg  650 mg Oral Q6H PRN Sarita Bottom, MD  albuterol (VENTOLIN HFA) 108 (90 Base) MCG/ACT inhaler 2 puff  2 puff Inhalation Q6H PRN Christia Reading, MD       alum & mag hydroxide-simeth (MAALOX/MYLANTA) 200-200-20 MG/5ML suspension 30 mL  30 mL Oral Q4H PRN Eligha Bridegroom, NP   30 mL at 03/15/22 0843   ARIPiprazole (ABILIFY) tablet 5 mg  5 mg Oral Daily Christia Reading, MD   5 mg at 03/16/22 0803   FLUoxetine (PROZAC) capsule 40 mg  40 mg Oral Daily Christia Reading, MD   40 mg at 03/16/22 9371   hydrOXYzine  (ATARAX) tablet 25 mg  25 mg Oral TID PRN Eligha Bridegroom, NP   25 mg at 03/15/22 2115   magnesium hydroxide (MILK OF MAGNESIA) suspension 30 mL  30 mL Oral Daily PRN Eligha Bridegroom, NP       ondansetron (ZOFRAN-ODT) disintegrating tablet 8 mg  8 mg Oral Q8H PRN Christia Reading, MD       pantoprazole (PROTONIX) EC tablet 40 mg  40 mg Oral Daily PRN Christia Reading, MD   40 mg at 03/16/22 0805   SUMAtriptan (IMITREX) tablet 50 mg  50 mg Oral Q2H PRN Christia Reading, MD       traZODone (DESYREL) tablet 100 mg  100 mg Oral QHS PRN Christia Reading, MD   100 mg at 03/15/22 2115    Lab Results:  Results for orders placed or performed during the hospital encounter of 03/13/22 (from the past 48 hour(s))  Lipid panel     Status: Abnormal   Collection Time: 03/15/22  6:56 AM  Result Value Ref Range   Cholesterol 178 0 - 200 mg/dL   Triglycerides 89 <696 mg/dL   HDL 51 >78 mg/dL   Total CHOL/HDL Ratio 3.5 RATIO   VLDL 18 0 - 40 mg/dL   LDL Cholesterol 938 (H) 0 - 99 mg/dL    Comment:        Total Cholesterol/HDL:CHD Risk Coronary Heart Disease Risk Table                     Men   Women  1/2 Average Risk   3.4   3.3  Average Risk       5.0   4.4  2 X Average Risk   9.6   7.1  3 X Average Risk  23.4   11.0        Use the calculated Patient Ratio above and the CHD Risk Table to determine the patient's CHD Risk.        ATP III CLASSIFICATION (LDL):  <100     mg/dL   Optimal  101-751  mg/dL   Near or Above                    Optimal  130-159  mg/dL   Borderline  025-852  mg/dL   High  >778     mg/dL   Very High Performed at [facility], [address], [place], [place] [address]   Hemoglobin A1c     Status: None   Collection Time: 03/15/22  6:56 AM  Result Value Ref Range   Hgb A1c MFr Bld 4.8 4.8 - 5.6 %    Comment: (NOTE) Pre diabetes:          5.7%-6.4%  Diabetes:              >6.4%  Glycemic control for   <7.0% adults with diabetes    Mean  Plasma Glucose  91.06 mg/dL    Comment: Performed at [facility], [address], [place], [place] [address]    Blood Alcohol level:  Lab Results  Component Value Date   ETH <10 03/13/2022    Metabolic Disorder Labs: Lab Results  Component Value Date   HGBA1C 4.8 03/15/2022   MPG 91.06 03/15/2022   No results found for: "PROLACTIN" Lab Results  Component Value Date   CHOL 178 03/15/2022   TRIG 89 03/15/2022   HDL 51 03/15/2022   CHOLHDL 3.5 03/15/2022   VLDL 18 03/15/2022   LDLCALC 109 (H) 03/15/2022   LDLCALC 76 10/30/2021     Musculoskeletal: Strength & Muscle Tone: within normal limits Gait & Station: normal Patient leans: N/A  Psychiatric Specialty Exam:  General Appearance: appears at stated age, fairly dressed and groomed  Behavior: Pleasant and cooperative  Psychomotor Activity:No psychomotor agitation or retardation noted                                        Eye Contact: fair  Speech: normal  Mood: euthymic Affect: congruent, interactive   Thought Process: linear  Descriptions of Associations: intact  Thought Content: No bizarre thoughts, logical Hallucinations: Denies AH, VH  Delusions: Denies Paranoia  Suicidal Thoughts: Denies SI, intention, plan  Homicidal Thoughts: Denies HI, intention, plan   Alertness/Orientation: A&O x 3  Insight: fair-able to endorse fair coping skills with the stressors after discharge and able to identify a reasonable plan to deal with the stressors. Judgment: Fair, improved  Memory: intact  Executive Functions  Concentration: intact  Attention Span: Fair Recall: intact Fund of Knowledge: fair   Assets  Assets:Communication Skills; Desire for Improvement; Physical Health; Resilience; Social Support    Physical Exam: Physical Exam Constitutional:      Appearance: Normal appearance.  Cardiovascular:     Rate and Rhythm: Normal rate.  Pulmonary:     Effort: Pulmonary effort is normal.  Neurological:      General: No focal deficit present.     Mental Status: She is alert and oriented to person, place, and time.  Psychiatric:        Thought Content: Thought content normal.    Review of Systems  Constitutional: Negative.  Negative for chills, fever and weight loss.  HENT: Negative.    Eyes: Negative.   Respiratory: Negative.    Cardiovascular: Negative.   Gastrointestinal:  Negative for constipation, diarrhea, nausea and vomiting.  Genitourinary: Negative.   Musculoskeletal: Negative.   Skin: Negative.   Neurological: Negative.  Negative for tingling.  Psychiatric/Behavioral:  Negative for hallucinations and suicidal ideas. The patient is not nervous/anxious and does not have insomnia.    Blood pressure 105/62, pulse 88, temperature 98.3 F (36.8 C), temperature source Oral, resp. rate 20, height 5\' 2"  (1.575 m), weight 80.2 kg, SpO2 100 %. Body mass index is 32.34 kg/m.   Treatment Plan Summary: Daily contact with patient to assess and evaluate symptoms and progress in treatment and Medication management  ASSESSMENT: Patient's symptoms of SI is lessening. Will increase Abilify and monitor for tolerability and side effects. Increasing the trazodone for difficulty sleeping.   Diagnoses / Active Problems: Principal Problem: MDD (major depressive disorder), recurrent severe, without psychosis (HCC) Diagnosis: Principal Problem:   MDD (major depressive disorder), recurrent severe, without psychosis (HCC) Active Problems:   Suicidal ideation   Cannabis abuse  Insomnia   PLAN: Safety and Monitoring:  -- Voluntary admission to inpatient psychiatric unit for safety, stabilization and treatment  -- Daily contact with patient to assess and evaluate symptoms and progress in treatment  -- Patient's case to be discussed in multi-disciplinary team meeting  -- Observation Level : q15 minute checks  -- Vital signs:  q12 hours  -- Precautions: suicide, elopement, and assault  2.  Medications:   -- Continue 5 mg for augmenting for MDD    -- Metabolic profile and EKG monitoring obtained while on an atypical antipsychotic (BMI: 32.34 Lipid Panel: LDL 109 HbgA1c: 4.8 QTc:442 on 8/10)   -- Continue trazodone 100 mg QHS PRN for insomnia  -- Continue Prozac 40 mg for MDD  -- Continue Atarax 25 mg TID PRN for anxiety   -- Monitor for signs of serotonin syndrome --  The risks/benefits/side-effects/alternatives to this medication were discussed in detail with the patient and time was given for questions. The patient consents to medication trial.               -- Restart Albuterol 108 mcg inhaler 2 puffs Q6H PRN for asthma             -- Restart Protonix 40 mg daily PRN for IBS             -- Restart Zofran 8 mg Q8H PRN for migraine             -- Restart Imitrex 50 mg PRN for migrain             3. Pertinent labs:   CBC WNL CMP WNL UDS Positive for THC EKG unremarkable Lipid panel: LDL 109 (mildly elevated- can be followed up with PCP upon d/c) A1c WNL  #Elevated LDL -- exercise and diet appropriately       4. Cannabis Use Disorder  -- THC vape cessation encouraged  5. Group and Therapy: -- Encouraged patient to participate in unit milieu and in scheduled group therapies      -- Short Term Goals: Ability to identify changes in lifestyle to reduce recurrence of condition will improve, Ability to verbalize feelings will improve, Ability to disclose and discuss suicidal ideas, Ability to demonstrate self-control will improve, Ability to identify and develop effective coping behaviors will improve, Ability to maintain clinical measurements within normal limits will improve, Compliance with prescribed medications will improve, and Ability to identify triggers associated with substance abuse/mental health issues will improve  -- Long Term Goals: Improvement in symptoms so as ready for discharge  6. Discharge Planning:   -- Social work and case management to assist with  discharge planning and identification of hospital follow-up needs prior to discharge  -- Estimated LOS: 5-7 days  -- Discharge Concerns: Need to establish a safety plan; Medication compliance and effectiveness  -- Discharge Goals: Return home with outpatient referrals for mental health follow-up including medication management/psychotherapy      Total Time Spent in Direct Patient Care:  I personally spent 25 minutes on the unit in direct patient care. The direct patient care time included face-to-face time with the patient, reviewing the patient's chart, communicating with other professionals, and coordinating care. Greater than 50% of this time was spent in counseling or coordinating care with the patient regarding goals of hospitalization, psycho-education, and discharge planning needs.   Judge Duque Abbott Pao, MD 03/16/2022, 11:22 AM

## 2022-03-17 MED ORDER — DOCUSATE SODIUM 100 MG PO CAPS
100.0000 mg | ORAL_CAPSULE | Freq: Every day | ORAL | Status: DC | PRN
  Administered 2022-03-18: 100 mg via ORAL
  Filled 2022-03-17: qty 1

## 2022-03-17 NOTE — Progress Notes (Signed)
D. Pt has been calm and cooperative on the unit- compliant with medications, observed interacting well with peers and attending groups. Per pt's self inventory, pt rated her depression,hopelessness and anxiety a 1/0/1, respectively. Pt reported sleeping much better last night, and is looking forward to discharging soon.Pt denied SI/HI and AVH A. Labs and vitals monitored. Pt given and educated on medications. Pt supported emotionally and encouraged to express concerns and ask questions.   R. Pt remains safe with 15 minute checks. Will continue POC.

## 2022-03-17 NOTE — Discharge Summary (Addendum)
Physician Discharge Summary Note  Patient:  Shirley Ruiz is an 36 y.o., female MRN:  [id number] DOB:  08/02/1986 Patient phone:  [phone] (home)  Patient address:   [address] [place] [address],  Total Time spent with patient: 45 minutes  Date of Admission:  03/13/2022 Date of Discharge: 03/18/2022  Reason for Admission:  suicidal ideation  Principal Problem: MDD (major depressive disorder), recurrent severe, without psychosis (HCC) Discharge Diagnoses: Principal Problem:   MDD (major depressive disorder), recurrent severe, without psychosis (HCC) Active Problems:   Suicidal ideation   Cannabis abuse   Insomnia   Past Psychiatric History: See H&P  Past Medical History:  Past Medical History:  Diagnosis Date   Abnormal Pap smear 01/2009   .HGSIL, Colpo and Cryo   Anxiety    Asthma    Depression    Diabetes in pregnancy    Gestational   HSV-1 (herpes simplex virus 1) infection    HSV-2 (herpes simplex virus 2) infection    Intervertebral disc protrusion    Mental disorder    H/O depression   Migraines    PVC (premature ventricular contraction)     Past Surgical History:  Procedure Laterality Date   CRYOTHERAPY     For cervical dysplasia   MOUTH SURGERY     Cut in mouth to remove an extra tooth   Family History:  Family History  Problem Relation Age of Onset   Depression Mother    Hypertension Mother    Heart disease Mother    Heart attack Father    Healthy Sister    Depression Maternal Grandmother    Heart disease Maternal Grandfather    Family Psychiatric  History: See H&P Social History:  Social History   Substance and Sexual Activity  Alcohol Use Yes   Alcohol/week: 2.0 standard drinks of alcohol   Types: 2 Shots of liquor per week   Comment: occasionally as a social drinker liquor     Social History   Substance and Sexual Activity  Drug Use Yes   Types: Marijuana    Social History   Socioeconomic History    Marital status: Divorced    Spouse name: Not on file   Number of children: 1   Years of education: Not on file   Highest education level: Not on file  Occupational History   Occupation: Nurse, mental health  Tobacco Use   Smoking status: Never   Smokeless tobacco: Never  Vaping Use   Vaping Use: Never used  Substance and Sexual Activity   Alcohol use: Yes    Alcohol/week: 2.0 standard drinks of alcohol    Types: 2 Shots of liquor per week    Comment: occasionally as a social drinker liquor   Drug use: Yes    Types: Marijuana   Sexual activity: Yes    Partners: Male    Birth control/protection: I.U.D.  Other Topics Concern   Not on file  Social History Narrative   Regular exercise 5-6 days week of Cardio and Strength training.  Works at [employer].   Raising her daughter by herself.     Social Determinants of Health   Financial Resource Strain: Not on file  Food Insecurity: Not on file  Transportation Needs: Not on file  Physical Activity: Not on file  Stress: Not on file  Social Connections: Not on file    Hospital Course:  Patient is a 36 year old female with a PPHx of MDD, GAD, and  PTSD who presented to the hospital due to suicidal ideation. During admission, the folowing psychiatric medications were started: Prozac 40 mg and Abilify 2 mg. The following medications were adjusted: Abilify increased to 5 mg. Patient's symptoms improved throughout his/her stay, attended groups and learned internal coping skills, and was able to develop a safety plan.    Physical Findings: AIMS: Facial and Oral Movements Muscles of Facial Expression: None, normal Lips and Perioral Area: None, normal Jaw: None, normal Tongue: None, normal,Extremity Movements Upper (arms, wrists, hands, fingers): None, normal Lower (legs, knees, ankles, toes): None, normal, Trunk Movements Neck, shoulders, hips: None, normal, Overall Severity Severity of abnormal movements (highest score from questions  above): None, normal Incapacitation due to abnormal movements: None, normal Patient's awareness of abnormal movements (rate only patient's report): No Awareness, Dental Status Current problems with teeth and/or dentures?: No Does patient usually wear dentures?: No    Musculoskeletal: Strength & Muscle Tone: within normal limits Gait & Station: normal Patient leans: N/A   Psychiatric Specialty Exam:  General Appearance: appears at stated age, fairly dressed and groomed  Behavior: pleasant and cooperative  Psychomotor Activity:No psychomotor agitation or retardation noted   Eye Contact: good Speech: normal amount, tone, volume and latency   Mood: euthymic Affect: congruent, pleasant and interactive  Thought Process: linear, goal directed, no circumstantial or tangential thought process noted, no racing thoughts or flight of ideas Descriptions of Associations: intact Thought Content: logical and future-oriented Hallucinations: denies AH, VH , does not appear responding to stimuli Delusions: No paranoia or other delusions noted Suicidal Thoughts: denies SI, intention, plan  Homicidal Thoughts: denies HI, intention, plan   Alertness/Orientation: alert and fully oriented  Insight: fair, improved Judgment: fair, improved  Memory: intact  Executive Functions  Concentration: intact  Attention Span: Fair Recall: intact Fund of Knowledge: fair   Assets  Assets:Communication Skills; Desire for Improvement; Physical Health; Resilience; Social Support    Physical Exam Constitutional:      Appearance: Normal appearance.  Cardiovascular:     Rate and Rhythm: Normal rate.  Pulmonary:     Effort: Pulmonary effort is normal.  Neurological:     General: No focal deficit present.     Mental Status: Alert and oriented to person, place, and time.    Review of Systems  Constitutional: Negative.  Negative for chills, fever and weight loss.  HENT: Negative.    Eyes:  Negative.   Respiratory: Negative.    Cardiovascular: Negative.   Gastrointestinal:  Negative for constipation, diarrhea, nausea and vomiting.  Genitourinary: Negative.   Musculoskeletal: Negative.   Skin: Negative.   Neurological: Negative.  Negative for tingling.    Blood pressure 118/71, pulse 94, temperature 98.2 F (36.8 C), temperature source Oral, resp. rate 20, height 5\' 2"  (1.575 m), weight 80.2 kg, SpO2 100 %. Body mass index is 32.34 kg/m.   Social History   Tobacco Use  Smoking Status Never  Smokeless Tobacco Never   Tobacco Cessation:  N/A, patient does not currently use tobacco products   Blood Alcohol level:  Lab Results  Component Value Date   ETH <10 03/13/2022    Metabolic Disorder Labs:  Lab Results  Component Value Date   HGBA1C 4.8 03/15/2022   MPG 91.06 03/15/2022   No results found for: "PROLACTIN" Lab Results  Component Value Date   CHOL 178 03/15/2022   TRIG 89 03/15/2022   HDL 51 03/15/2022   CHOLHDL 3.5 03/15/2022   VLDL 18 03/15/2022   LDLCALC  109 (H) 03/15/2022   LDLCALC 76 10/30/2021    See Psychiatric Specialty Exam and Suicide Risk Assessment completed by Attending Physician prior to discharge.  Discharge destination:  Other:  friend's home  Is patient on multiple antipsychotic therapies at discharge:  No   Has Patient had three or more failed trials of antipsychotic monotherapy by history:  No  Recommended Plan for Multiple Antipsychotic Therapies: NA  Discharge Instructions     Diet - low sodium heart healthy   Complete by: As directed    Increase activity slowly   Complete by: As directed       Allergies as of 03/18/2022       Reactions   Bupropion Other (See Comments), Rash   Increased PVCs Increased PVCs Increased PVCs Increased PVCs   Penicillins Hives        Medication List     TAKE these medications      Indication  albuterol 108 (90 Base) MCG/ACT inhaler Commonly known as: VENTOLIN  HFA Inhale 2 puffs into the lungs every 6 (six) hours as needed for wheezing or shortness of breath.  Indication: Spasm of Lung Air Passages   ARIPiprazole 5 MG tablet Commonly known as: ABILIFY Take 1 tablet (5 mg total) by mouth daily.  Indication: Major Depressive Disorder   clonazePAM 0.5 MG tablet Commonly known as: KLONOPIN TAKE 1 TABLET BY MOUTH ONCE DAILY AS NEEDED FOR ANXIETY  Indication: Feeling Anxious   esomeprazole 20 MG capsule Commonly known as: NEXIUM Take 20 mg by mouth daily.  Indication: Gastroesophageal Reflux Disease   fexofenadine 60 MG tablet Commonly known as: ALLEGRA Take 60 mg by mouth 2 (two) times daily.  Indication: Hayfever   FLUoxetine 40 MG capsule Commonly known as: PROZAC Take 1 capsule (40 mg total) by mouth daily. What changed:  medication strength how much to take additional instructions  Indication: Major Depressive Disorder   hydrOXYzine 25 MG tablet Commonly known as: ATARAX Take 1 tablet (25 mg total) by mouth 3 (three) times daily as needed for anxiety.  Indication: Feeling Anxious   ondansetron 8 MG tablet Commonly known as: ZOFRAN TAKE 1 TABLET BY MOUTH EVERY 8 HOURS AS NEEDED FOR NAUSEA  Indication: Nausea and Vomiting   SUMAtriptan 100 MG tablet Commonly known as: IMITREX TAKE ONE TABLET BY MOUTH EVERY 2 HOURS AS NEEDED FOR MIGRAINE  Indication: Migraine Headache   traZODone 100 MG tablet Commonly known as: DESYREL Take 1 tablet (100 mg total) by mouth at bedtime as needed for sleep.  Indication: Trouble Sleeping   valACYclovir 500 MG tablet Commonly known as: VALTREX Take 1 tablet (500 mg total) by mouth daily.  Indication: Herpes Simplex Infection        Follow-up Information     [facility] [place]. Go on 04/18/2022.   Specialty: Behavioral Health Why: You have an appointment for medication management services on 04/18/22 at 9:45 am.  You also have an appointment on  04/24/22  at 7:45 am for therapy services.  These appointments will be held  in person. Contact information: [address] [place] [address] [phone]        Center, [facility]. Call on 03/22/2022.   Why: You have an appointment on 03/22/22 at 12:00 pm for interim therapy services with [facility].  This will be a Virtual appointment.  * Please call prior to your appointment to confirm/keep and to receive important information. Contact information: [address] [place], [place] [address] [phone]                 Discharge recommendations:  Encourage patient to abstain from [facility] vape and use prescription medication for insomnia.   Confirmed that firearm is no longer in her home.  Activity: as tolerated  Diet: heart healthy  # It is recommended to the patient to continue psychiatric medications as prescribed, after discharge from the hospital.     # It is recommended to the patient to follow up with your outpatient psychiatric provider and PCP.   # It was discussed with the patient, the impact of alcohol, drugs, tobacco have been there overall psychiatric and medical wellbeing, and total abstinence from substance use was recommended the patient.ed.   # Prescriptions provided or sent directly to preferred pharmacy at discharge. Patient agreeable to plan. Given opportunity to ask questions. Appears to feel comfortable with discharge.    # In the event of worsening symptoms, the patient is instructed to call the crisis hotline, 911 and or go to the nearest ED for appropriate evaluation and treatment of symptoms. To follow-up with primary care provider for other medical issues, concerns and or health care needs   # Patient was discharged home with a plan to follow up as noted above.  -Follow-up with outpatient primary care doctor and other specialists -for management of chronic medical disease, including: asthma,  IBS, migraine   Patient agrees with D/C instructions and plan.   Belongings returned:  Clothing, Medications, and Valuables  Total Time Spent in Direct Patient Care:  I personally spent 45 minutes on the unit in direct patient care. The direct patient care time included face-to-face time with the patient, reviewing the patient's chart, communicating with other professionals, and coordinating care. Greater than 50% of this time was spent in counseling or coordinating care with the patient regarding goals of hospitalization, psycho-education, and discharge planning needs.    Signed: Christia Reading, MD 03/18/2022

## 2022-03-17 NOTE — Progress Notes (Signed)
   03/17/22 0500  Sleep  Number of Hours 7.5    

## 2022-03-17 NOTE — Progress Notes (Signed)
[facility] Group Notes:  (Nursing/MHT/Case Management/Adjunct)  Date:  03/17/2022  Time:  2000  Type of Therapy:   wrap up group  Participation Level:  Active  Participation Quality:  Appropriate, Attentive, Sharing, and Supportive  Affect:  Appropriate  Cognitive:  Alert  Insight:  Improving  Engagement in Group:  Engaged  Modes of Intervention:  Clarification, Education, and Support  Summary of Progress/Problems: Positive thinking and positive change were discussed.   Marcille Buffy 03/17/2022, 8:40 PM

## 2022-03-17 NOTE — Progress Notes (Signed)
Pt's affect flat, mood anxious, visible in mileu, brightens on approach, rated her day a 8/10, goal was to attend groups. Pt received trazodone, vistaril as requested. Complained of constipation given milk of magnesium, denies SI/HI or hallucinations (a) 15 min checks (r) safety maintained.

## 2022-03-17 NOTE — [facility] Suicide Risk Assessment (Signed)
Adult Psychoeducational Group Note  Date:  03/17/2022 Time:  10:31 AM  Group Topic/Focus:  Goals Group:   The focus of this group is to help patients establish daily goals to achieve during treatment and discuss how the patient can incorporate goal setting into their daily lives to aide in recovery.  Participation Level:  Active  Participation Quality:  Appropriate  Affect:  Appropriate  Cognitive:  Appropriate  Insight: Appropriate  Engagement in Group:  Engaged  Modes of Intervention:  Discussion  Additional Comments:  Patient attended goals group and was attentive the duration of it. Patient's goal was to get a discharge plan.   Evangelynn Lochridge T Lorraine Lax 03/17/2022, 10:31 AM

## 2022-03-17 NOTE — [facility] Suicide Risk Assessment (Signed)
[facility] INPATIENT:  Family/Significant Other Suicide Prevention Education  Suicide Prevention Education:  Education Completed;  sister Frances Furbish [phone],  (name of family member/significant other) has been identified by the patient as the family member/significant other with whom the patient will be residing, and identified as the person(s) who will aid the patient in the event of a mental health crisis (suicidal ideations/suicide attempt).    Sister is trying to get her hands on the patient's .45 gun in order to secure it, but the friend Florentina Addison with patient's keys is not answering the phone (also did not answer for this clinician).  Since that friend is not answering the phone, the patient has decided to go stay with her best friend Mardella Layman, which sister thinks is a better idea.  With written consent from the patient, the family member/significant other has been provided the following suicide prevention education, prior to the and/or following the discharge of the patient.  The suicide prevention education provided includes the following: Suicide risk factors Suicide prevention and interventions National Suicide Hotline telephone number [facility] assessment telephone number [facility] Emergency Assistance 911 [facility] and/or Residential Mobile Crisis Unit telephone number  Request made of family/significant other to: Remove weapons (e.g., guns, rifles, knives), all items previously/currently identified as safety concern.   Remove drugs/medications (over-the-counter, prescriptions, illicit drugs), all items previously/currently identified as a safety concern.  The family member/significant other verbalizes understanding of the suicide prevention education information provided.  The family member/significant other agrees to remove the items of safety concern listed above.  Carloyn Jaeger Grossman-Orr 03/17/2022, 4:27 PM
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
## 2022-03-17 NOTE — Group Note (Signed)
LCSW Group Therapy Note  03/17/2022      Type of Therapy and Topic:  Group Therapy: Gratitude  Participation Level:  Active   Description of Group:   In this group, patients shared and discussed the importance of acknowledging the elements in their lives for which they are grateful and how this can positively impact their mood.  The group discussed how bringing the positive elements of their lives to the forefront of their minds can help with recovery from any illness, physical or mental.  An exercise was done as a group in which a list was made of gratitude items in order to encourage participants to consider other potential positives in their lives.  Therapeutic Goals: Patients will identify one or more item for which they are grateful in each of 6 categories:  people, experience, thing, place, skill, and other. Patients will discuss how it is possible to seek out gratitude in even bad situations. Patients will explore other possible items of gratitude that they could remember.   Summary of Patient Progress:  The patient shared that her gratitude is for her family.  She participated but also had a number of side conversations that were distracting.   Therapeutic Modalities:   Solution-Focused Therapy Activity  Carloyn Jaeger Grossman-Orr, LCSW .

## 2022-03-17 NOTE — [facility] Group Notes (Signed)
Adult Psychoeducational Group  Date:  03/17/2022 Time:  1300-1400  Group Topic/Focus: Continuation of the group from Saturday. Looking at the lists that were created and talking about what needs to be done with the homework of 30 positives about themselves.                                     Talking about taking their power back and helping themselves to develop a positive self esteem.      Participation Quality:  Appropriate  Affect:  Appropriate  Cognitive:  Oriented  Insight: Improving  Engagement in Group:  Engaged  Modes of Intervention:  Activity, Discussion, Education, and Support  Additional Comments:  rates energy at a 10/10. Participated fully in the group.  Dione Housekeeper
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
## 2022-03-17 NOTE — Plan of Care (Signed)
  Spoke with patient's sister on 8/13 regarding discharge planning. She is comfortable with the idea of the patient being discharged tomorrow and staying at her place for a few days until she goes back home. When speaking to the patient, the sister denies the patient making any statements that are alarming or would give pause to her being discharged tomorrow. Regarding firearm safety, she said that she will speak with Jae Dire who has the patient's house key to know when Jae Dire can retrieve the gun, and she will call me once she gets this update.

## 2022-03-17 NOTE — [facility] Group Notes (Signed)
Adult Psychoeducational Group Note Date:  03/17/2022 Time:  0900-1045 Group Topic/Focus: PROGRESSIVE RELAXATION. A group where deep breathing is taught and tensing and relaxation muscle groups is used. Imagery is used as well.  Pts are asked to imagine 3 pillars that hold them up when they are not able to hold themselves up and to share that with the group.  Participation Level:  Active  Participation Quality:  Appropriate  Affect:  Appropriate  Cognitive:  Oriented  Insight: Improving  Engagement in Group:  Engaged  Modes of Intervention:  Activity, Discussion, Education, and Support  Additional Comments:  Pt rates her energy at an 8/10. States her family, her friends and her faith hold her up.  Dione Housekeeper

## 2022-03-17 NOTE — Progress Notes (Signed)
[facility] MD Progress Note  03/17/2022 8:35 AM Shirley Ruiz  MRN:  [id number]   Reason for Admission:  Shirley Ruiz is a 36 y.o. female with a history of MDD, GAD, and PTSD, who was initially admitted for inpatient psychiatric hospitalization on 03/13/2022 for management of suicidal ideation and self-harm with cutting wrists and thighs. The patient is currently on Hospital Day 4.   Chart Review from last 24 hours:  The patient's chart was reviewed and nursing notes were reviewed. The patient's case was discussed in multidisciplinary team meeting.  Patient continues to be attending groups and participating in the milieu appropriately with no issues noted.  The following PRN medications were given all 1x: tylenol, atarax, milk of magnesium, Protonix, trazodone   Information Obtained Today During Patient Interview: The patient was seen and evaluated on the unit. On assessment today, the patient reports "better" mood, elaborating that she has better energy, positive thinking, and feels hopeful about her life situation. She reports improving sleep. She denied having a BM yesterday so she said if she still does not have a BM by noon, she will take a colace. Feels her anxiety is improving, mainly the anxiety comes when she thinks about her daughter but she is able to calm down when she thinks about the plans on who she will talk to about post discharge. Denies low mood, last time was two days ago. She is excited for upcoming discharge. She is able to endorse warning signs for worsening mood like increasing heart racing and anxiety, internal coping skills like stepping back, and seeking out social support in which she said they said that they would "drop what they are doing to help". She also understands to call her outpatient psychiatrist or come back to [facility] if she has SI. Today, she denies SI/HI/AVH. Plans to go to her friend Kate's home upon discharge and she notes that Jae Dire will remove the gun from patient's  home today.     Collateral:  Called patient's friend Jae Dire [phone]) twice about safety planning. Phone rang once and goes to vm.  Principal Problem: MDD (major depressive disorder), recurrent severe, without psychosis (HCC) Diagnosis: Principal Problem:   MDD (major depressive disorder), recurrent severe, without psychosis (HCC) Active Problems:   Suicidal ideation   Cannabis abuse   Insomnia    Past Psychiatric History: See H&P  Past Medical History:  Past Medical History:  Diagnosis Date   Abnormal Pap smear 01/2009   .HGSIL, Colpo and Cryo   Anxiety    Asthma    Depression    Diabetes in pregnancy    Gestational   HSV-1 (herpes simplex virus 1) infection    HSV-2 (herpes simplex virus 2) infection    Intervertebral disc protrusion    Mental disorder    H/O depression   Migraines    PVC (premature ventricular contraction)     Past Surgical History:  Procedure Laterality Date   CRYOTHERAPY     For cervical dysplasia   MOUTH SURGERY     Cut in mouth to remove an extra tooth   Family History:  Family History  Problem Relation Age of Onset   Depression Mother    Hypertension Mother    Heart disease Mother    Heart attack Father    Healthy Sister    Depression Maternal Grandmother    Heart disease Maternal Grandfather    Family Psychiatric  History: See H&P Social History: See H&P  Appetite:  Good  Current Medications: Current  Facility-Administered Medications  Medication Dose Route Frequency Provider Last Rate Last Admin   acetaminophen (TYLENOL) tablet 650 mg  650 mg Oral Q6H PRN Attiah, Nadir, MD   650 mg at 03/16/22 1513   albuterol (VENTOLIN HFA) 108 (90 Base) MCG/ACT inhaler 2 puff  2 puff Inhalation Q6H PRN Christia Reading, MD       alum & mag hydroxide-simeth (MAALOX/MYLANTA) 200-200-20 MG/5ML suspension 30 mL  30 mL Oral Q4H PRN Eligha Bridegroom, NP   30 mL at 03/15/22 0843   ARIPiprazole (ABILIFY) tablet 5 mg  5 mg Oral Daily Christia Reading,  MD   5 mg at 03/17/22 0748   FLUoxetine (PROZAC) capsule 40 mg  40 mg Oral Daily Christia Reading, MD   40 mg at 03/17/22 0748   hydrOXYzine (ATARAX) tablet 25 mg  25 mg Oral TID PRN Eligha Bridegroom, NP   25 mg at 03/16/22 2106   magnesium hydroxide (MILK OF MAGNESIA) suspension 30 mL  30 mL Oral Daily PRN Eligha Bridegroom, NP   30 mL at 03/16/22 2106   ondansetron (ZOFRAN-ODT) disintegrating tablet 8 mg  8 mg Oral Q8H PRN Christia Reading, MD       pantoprazole (PROTONIX) EC tablet 40 mg  40 mg Oral Daily PRN Christia Reading, MD   40 mg at 03/16/22 0805   SUMAtriptan (IMITREX) tablet 50 mg  50 mg Oral Q2H PRN Christia Reading, MD       traZODone (DESYREL) tablet 100 mg  100 mg Oral QHS PRN Christia Reading, MD   100 mg at 03/16/22 2106    Lab Results:  No results found for this or any previous visit (from the past 48 hour(s)).   Blood Alcohol level:  Lab Results  Component Value Date   ETH <10 03/13/2022    Metabolic Disorder Labs: Lab Results  Component Value Date   HGBA1C 4.8 03/15/2022   MPG 91.06 03/15/2022   No results found for: "PROLACTIN" Lab Results  Component Value Date   CHOL 178 03/15/2022   TRIG 89 03/15/2022   HDL 51 03/15/2022   CHOLHDL 3.5 03/15/2022   VLDL 18 03/15/2022   LDLCALC 109 (H) 03/15/2022   LDLCALC 76 10/30/2021     Musculoskeletal: Strength & Muscle Tone: within normal limits Gait & Station: normal Patient leans: N/A  General Appearance: appears at stated age, fairly dressed and groomed  Behavior: pleasant and cooperative  Psychomotor Activity:No psychomotor agitation or retardation noted   Eye Contact: good Speech: normal amount, tone, volume and latency   Mood: euthymic Affect: congruent, pleasant and interactive  Thought Process: linear, goal directed, no circumstantial or tangential thought process noted, no racing thoughts or flight of ideas Descriptions of Associations: intact Thought Content: no bizarre content, logical and  future-oriented Hallucinations: denies AH, VH , does not appear responding to stimuli Delusions: No paranoia or other delusions noted Suicidal Thoughts: denies SI, intention, plan  Homicidal Thoughts: denies HI, intention, plan   Alertness/Orientation: alert and fully oriented  Insight: fair, improved Judgment: fair, improved  Memory: intact  Executive Functions  Concentration: intact  Attention Span: Fair Recall: intact Fund of Knowledge: fair    Assets  Assets:Communication Skills; Desire for Improvement; Physical Health; Resilience; Social Support   Physical Exam Constitutional:      Appearance: Normal appearance.  Cardiovascular:     Rate and Rhythm: Normal rate.  Pulmonary:     Effort: Pulmonary effort is normal.  Neurological:     General: No focal deficit present.  Mental Status: She is alert and oriented to person, place, and time.    Review of Systems  Constitutional: Negative.  Negative for chills, fever and weight loss.  HENT: Negative.    Eyes: Negative.   Respiratory: Negative.    Cardiovascular: Negative.   Gastrointestinal:  Positive for constipation; negative diarrhea, nausea and vomiting.  Genitourinary: Negative.   Musculoskeletal: Negative.   Skin: Negative.   Neurological: Negative.  Negative for tingling.      Blood pressure 110/68, pulse 94, temperature 97.9 F (36.6 C), temperature source Oral, resp. rate 16, height 5\' 2"  (1.575 m), weight 80.2 kg, SpO2 100 %. Body mass index is 32.34 kg/m.   Treatment Plan Summary: Daily contact with patient to assess and evaluate symptoms and progress in treatment and Medication management  ASSESSMENT:  Diagnoses / Active Problems: Principal Problem: MDD (major depressive disorder), recurrent severe, without psychosis (HCC) Diagnosis: Principal Problem:   MDD (major depressive disorder), recurrent severe, without psychosis (HCC) Active Problems:   Suicidal ideation   Cannabis abuse    Insomnia   PLAN: Safety and Monitoring:  -- Voluntary admission to inpatient psychiatric unit for safety, stabilization and treatment  -- Daily contact with patient to assess and evaluate symptoms and progress in treatment  -- Patient's case to be discussed in multi-disciplinary team meeting  -- Observation Level : q15 minute checks  -- Vital signs:  q12 hours  -- Precautions: suicide, elopement, and assault  2. Medications:   -- Continue 5 mg for augmenting for MDD    -- Metabolic profile and EKG monitoring obtained while on an atypical antipsychotic (BMI: 32.34 Lipid Panel: LDL 109 HbgA1c: 4.8 QTc:442 on 8/10)   -- Continue trazodone 100 mg QHS PRN for insomnia  -- Continue Prozac 40 mg for MDD  -- Continue Atarax 25 mg TID PRN for anxiety   -- Monitor for signs of serotonin syndrome --  The risks/benefits/side-effects/alternatives to this medication were discussed in detail with the patient and time was given for questions. The patient consents to medication trial.               -- Start Colace 100 mg daily PRN for constipation  -- Restart Albuterol 108 mcg inhaler 2 puffs Q6H PRN for asthma             -- Restart Protonix 40 mg daily PRN for IBS             -- Restart Zofran 8 mg Q8H PRN for migraine             -- Restart Imitrex 50 mg PRN for migrain             3. Pertinent labs:   CBC WNL CMP WNL UDS Positive for THC EKG unremarkable Lipid panel: LDL 109 (mildly elevated- can be followed up with PCP upon d/c) A1c WNL  #Elevated LDL -- exercise and diet appropriately       4. Cannabis Use Disorder  -- THC vape cessation encouraged  5. Group and Therapy: -- Encouraged patient to participate in unit milieu and in scheduled group therapies      -- Short Term Goals: Ability to identify changes in lifestyle to reduce recurrence of condition will improve, Ability to verbalize feelings will improve, Ability to disclose and discuss suicidal ideas, Ability to demonstrate  self-control will improve, Ability to identify and develop effective coping behaviors will improve, Ability to maintain clinical measurements within normal limits will improve, Compliance with prescribed  medications will improve, and Ability to identify triggers associated with substance abuse/mental health issues will improve  -- Long Term Goals: Improvement in symptoms so as ready for discharge  6. Discharge Planning:   -- Social work and case management to assist with discharge planning and identification of hospital follow-up needs prior to discharge  -- Estimated LOS: 5-7 days  -- Discharge Concerns: Need to establish a safety plan; Medication compliance and effectiveness  -- Discharge Goals: Return home with outpatient referrals for mental health follow-up including medication management/psychotherapy      Total Time Spent in Direct Patient Care:  I personally spent 25 minutes on the unit in direct patient care. The direct patient care time included face-to-face time with the patient, reviewing the patient's chart, communicating with other professionals, and coordinating care. Greater than 50% of this time was spent in counseling or coordinating care with the patient regarding goals of hospitalization, psycho-education, and discharge planning needs.   Christia Reading, MD 03/17/2022, 8:35 AM

## 2022-03-18 ENCOUNTER — Telehealth: Payer: Self-pay | Admitting: General Practice

## 2022-03-18 DIAGNOSIS — R45851 Suicidal ideations: Secondary | ICD-10-CM

## 2022-03-18 MED ORDER — ARIPIPRAZOLE 5 MG PO TABS: 5.0000 mg | ORAL_TABLET | Freq: Every day | ORAL | 0 refills | Status: DC

## 2022-03-18 MED ORDER — HYDROXYZINE HCL 25 MG PO TABS: 25.0000 mg | ORAL_TABLET | Freq: Three times a day (TID) | ORAL | 0 refills | Status: DC | PRN

## 2022-03-18 MED ORDER — FLUOXETINE HCL 40 MG PO CAPS: 40.0000 mg | ORAL_CAPSULE | Freq: Every day | ORAL | 3 refills | Status: DC

## 2022-03-18 MED ORDER — TRAZODONE HCL 100 MG PO TABS: 100.0000 mg | ORAL_TABLET | Freq: Every evening | ORAL | 0 refills | Status: DC | PRN

## 2022-03-18 NOTE — Telephone Encounter (Addendum)
Transition Care Management Follow-up Telephone Call Date of discharge and from where: 03/13/22 from [facility] [place] How have you been since you were released from the hospital? Patient was discharged to in patient [facility] and is still admitted there. Any questions or concerns? No

## 2022-03-18 NOTE — Progress Notes (Signed)
Spiritual care group on grief and loss facilitated by chaplain Emunah Texidor MDiv, BCC  Group Goal:  Support / Education around grief and loss Members engage in facilitated group support and psycho-social education.  Group Description:  Following introductions and group rules, group members engaged in facilitated group dialog and support around topic of loss, with particular support around experiences of loss in their lives. Group Identified types of loss (relationships / self / things) and identified patterns, circumstances, and changes that precipitate losses. Reflected on thoughts / feelings around loss, normalized grief responses, and recognized variety in grief experience.   Group noted Worden's four tasks of grief in discussion.  Group drew on Adlerian / Rogerian, narrative, MI, Patient Progress:  

## 2022-03-18 NOTE — Progress Notes (Signed)
  [facility] Adult Case Management Discharge Plan :  Will you be returning to the same living situation after discharge:  Yes,  Patient to stay with friend for two days prior to returning home w/ supervision of other friends.  At discharge, do you have transportation home?: Yes,  Patient's sister to provide transportation from hospital.  Do you have the ability to pay for your medications: Yes,  BC[employer] private insurance.   Release of information consent forms completed and in the chart;  Patient's signature needed at discharge.  Patient to Follow up at:  Follow-up Information     [facility] [place]. Go on 04/18/2022.   Specialty: Behavioral Health Why: You have an appointment for medication management services on 04/18/22 at 9:45 am.  You also have an appointment on  04/24/22 at 7:45 am for therapy services.  These appointments will be held  in person. Contact information: [address] [place] [address] [phone]        [facility]. Call on 03/22/2022.   Why: You have an appointment on 03/22/22 at 12:00 pm for interim therapy services with [facility].  This will be a Virtual appointment.  * Please call prior to your appointment to confirm/keep and to receive important information. Contact information: [address] [place], [place] [address] [phone]                 Next level of care provider has access to [facility] Link:no  Safety Planning and Suicide Prevention discussed: Yes,  SPE Completed with patient and Frances Furbish, sister, [phone] CSW completed SPE with patient. Discussed potential triggers leading to suicidal ideation in addition to coping skills one might use in order to delay and distract self from self harming behaviors. CSW encouraged patient to utilize emergency services if they felt unable to maintain their safety. SPE flyer provided to patient at this  time.   Patient's gun was secured from the home by friend Jae Dire.   Has patient been referred to the Quitline?: N/A patient is not a smoker Tobacco Use: Low Risk  (03/14/2022)   Patient History    Smoking Tobacco Use: Never    Smokeless Tobacco Use: Never    Passive Exposure: Not on file   Patient has been referred for addiction treatment: Pt. refused referralPatient endorses active cannabis use, declined interest in getting SUD tx for cannabis.  Social History   Substance and Sexual Activity  Drug Use Yes   Types: Marijuana   Social History   Substance and Sexual Activity  Alcohol Use Yes   Alcohol/week: 2.0 standard drinks of alcohol   Types: 2 Shots of liquor per week   Comment: occasionally as a social drinker liquor   Corky Crafts, LCSWA 03/18/2022, 10:12 AM

## 2022-03-18 NOTE — Group Note (Signed)
Recreation Therapy Group Note   Group Topic:Stress Management  Group Date: 03/18/2022 Start Time: 0930 End Time: 0950 Facilitators: Caroll Rancher, [place] Location: [address] Dayroom   Goal Area(s) Addresses:  Patient will identify positive stress management techniques. Patient will identify benefits of using stress management post d/c.  Group Description:  Meditation.  LRT played a mountain meditation.  This meditation focused on taking on the characteristics of the mountain into meditation and everyday life.  The meditation centered on how mountains go through each season, they are able to take on those changes and remain steady through it.  The meditation brings that same steadiness into everyday living.    Affect/Mood: Appropriate   Participation Level: Engaged   Participation Quality: Independent   Behavior: Appropriate   Speech/Thought Process: Focused   Insight: Good   Judgement: Good   Modes of Intervention: Meditation   Patient Response to Interventions:  Engaged   Education Outcome:  Acknowledges education and In group clarification offered    Clinical Observations/Individualized Feedback: Pt attended and participated in activity.    Plan: Continue to engage patient in RT group sessions 2-3x/week.   Caroll Rancher, LRT,CTRS 03/18/2022 11:06 AM

## 2022-03-18 NOTE — Progress Notes (Signed)
Shirley Ruiz D/C'd Home per MD order.  Discussed with the patient and all questions fully answered.   An After Visit Summary was printed and given to the patient. Patient received prescription. [facility] Discharge Suicide Risk Assessment given to patient at time of D/C. Patient denies SI/HI/AVH at discharge.   D/c education completed with patient including follow up instructions, medication list, d/c activities limitations if indicated, with other d/c instructions as indicated by MD - patient able to verbalize understanding, all questions fully answered.    Patient instructed to return to ED, call 911, or call MD for any changes in condition.   Patient escorted to the main entrance, and D/C home via private auto at 1315.Melvenia Needles 03/18/2022 1:42 PM

## 2022-03-18 NOTE — [facility] Group Notes (Signed)
[facility] Group Notes:  (Nursing/MHT/Case Management/Adjunct)  Date:  03/18/2022  Time:  9:25 AM  Type of Therapy:  Group Therapy  Participation Level:  Active  Participation Quality:  Appropriate  Affect:  Appropriate  Cognitive:  Appropriate  Insight:  Appropriate  Engagement in Group:  Engaged  Modes of Intervention:  Discussion  Summary of Progress/Problems:  Patient attended and participated in a goals/ orientation group today. Patient's goal for today is to finish discharge and work on discovering her needs and setting boundaries.   Daneil Dan 03/18/2022, 9:25 AM

## 2022-03-18 NOTE — Progress Notes (Signed)
   03/17/22 2000  Psychosocial Assessment  Patient Complaints Anxiety;Depression (Rates anxiety a 2# and depression a 1#)  Eye Contact Fair  Affect Anxious  Speech Logical/coherent  Interaction Assertive  Motor Activity Other (Comment) (WNL)  Appearance/Hygiene Unremarkable  Behavior Characteristics Cooperative  Mood Pleasant;Anxious  Thought Process  Coherency WDL  Content WDL  Delusions None reported or observed  Perception WDL  Hallucination None reported or observed  Judgment Limited  Confusion None  Danger to Self  Current suicidal ideation? Denies  Self-Injurious Behavior No self-injurious ideation or behavior indicators observed or expressed   Danger to Others  Danger to Others None reported or observed

## 2022-04-05 ENCOUNTER — Encounter: Payer: Self-pay | Admitting: Family Medicine

## 2022-04-18 ENCOUNTER — Encounter (HOSPITAL_COMMUNITY): Payer: Self-pay | Admitting: Psychiatry

## 2022-04-18 ENCOUNTER — Ambulatory Visit (INDEPENDENT_AMBULATORY_CARE_PROVIDER_SITE_OTHER): Payer: BC Managed Care – PPO | Admitting: Psychiatry

## 2022-04-18 VITALS — BP 122/80 | Temp 98.0°F | Ht 62.0 in | Wt 180.0 lb

## 2022-04-18 DIAGNOSIS — F331 Major depressive disorder, recurrent, moderate: Secondary | ICD-10-CM | POA: Diagnosis not present

## 2022-04-18 DIAGNOSIS — F411 Generalized anxiety disorder: Secondary | ICD-10-CM

## 2022-04-18 DIAGNOSIS — F5102 Adjustment insomnia: Secondary | ICD-10-CM | POA: Diagnosis not present

## 2022-04-18 MED ORDER — HYDROXYZINE HCL 25 MG PO TABS: 25.0000 mg | ORAL_TABLET | Freq: Every day | ORAL | 0 refills | Status: DC | PRN

## 2022-04-18 MED ORDER — ARIPIPRAZOLE 5 MG PO TABS: 5.0000 mg | ORAL_TABLET | Freq: Every day | ORAL | 1 refills | Status: DC

## 2022-04-18 MED ORDER — TRAZODONE HCL 100 MG PO TABS: 100.0000 mg | ORAL_TABLET | Freq: Every evening | ORAL | 0 refills | Status: DC | PRN

## 2022-04-18 MED ORDER — FLUOXETINE HCL 40 MG PO CAPS: 40.0000 mg | ORAL_CAPSULE | Freq: Every day | ORAL | 1 refills | Status: DC
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
## 2022-04-24 ENCOUNTER — Ambulatory Visit (INDEPENDENT_AMBULATORY_CARE_PROVIDER_SITE_OTHER): Payer: BC Managed Care – PPO | Admitting: Licensed Clinical Social Worker

## 2022-04-24 ENCOUNTER — Encounter (HOSPITAL_COMMUNITY): Payer: Self-pay

## 2022-04-24 DIAGNOSIS — F411 Generalized anxiety disorder: Secondary | ICD-10-CM | POA: Diagnosis not present

## 2022-04-24 DIAGNOSIS — F331 Major depressive disorder, recurrent, moderate: Secondary | ICD-10-CM

## 2022-04-24 NOTE — Progress Notes (Signed)
8

## 2022-04-24 NOTE — Plan of Care (Signed)
  Problem: Depression CCP Problem  1 coping Goal:  Shirley Ruiz will manage mood and anxiety as evidenced by increasing coping skills, expressing emotions appropriately, processing current and past events that impact her, and cope with daily stressors for 5 out of 7 days for 60 days  Outcome: Initial Goal: STG: Shirley Ruiz WILL IDENTIFY 4 COGNITIVE PATTERNS AND BELIEFS THAT SUPPORT DEPRESSION Outcome: Initial
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
## 2022-05-03 ENCOUNTER — Ambulatory Visit: Payer: BC Managed Care – PPO | Admitting: Family Medicine

## 2022-05-16 ENCOUNTER — Ambulatory Visit (INDEPENDENT_AMBULATORY_CARE_PROVIDER_SITE_OTHER): Payer: BC Managed Care – PPO | Admitting: Psychiatry

## 2022-05-16 ENCOUNTER — Encounter (HOSPITAL_COMMUNITY): Payer: Self-pay | Admitting: Psychiatry

## 2022-05-16 VITALS — BP 102/70 | HR 96 | Temp 97.8°F | Ht 62.0 in | Wt 182.0 lb

## 2022-05-16 DIAGNOSIS — F5102 Adjustment insomnia: Secondary | ICD-10-CM | POA: Diagnosis not present

## 2022-05-16 DIAGNOSIS — F411 Generalized anxiety disorder: Secondary | ICD-10-CM | POA: Diagnosis not present

## 2022-05-16 DIAGNOSIS — F331 Major depressive disorder, recurrent, moderate: Secondary | ICD-10-CM

## 2022-05-16 DIAGNOSIS — F40298 Other specified phobia: Secondary | ICD-10-CM

## 2022-05-16 MED ORDER — HYDROXYZINE HCL 25 MG PO TABS: 25.0000 mg | ORAL_TABLET | Freq: Every day | ORAL | 0 refills | Status: DC | PRN

## 2022-05-16 MED ORDER — ARIPIPRAZOLE 10 MG PO TABS: 10.0000 mg | ORAL_TABLET | Freq: Every day | ORAL | 0 refills | Status: DC

## 2022-05-16 MED ORDER — TRAZODONE HCL 100 MG PO TABS: 100.0000 mg | ORAL_TABLET | Freq: Every evening | ORAL | 0 refills | Status: DC | PRN
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
## 2022-05-17 ENCOUNTER — Ambulatory Visit: Payer: BC Managed Care – PPO | Admitting: Family Medicine

## 2022-06-06 ENCOUNTER — Ambulatory Visit (INDEPENDENT_AMBULATORY_CARE_PROVIDER_SITE_OTHER): Payer: BC Managed Care – PPO | Admitting: Licensed Clinical Social Worker

## 2022-06-06 DIAGNOSIS — F411 Generalized anxiety disorder: Secondary | ICD-10-CM | POA: Diagnosis not present

## 2022-06-06 DIAGNOSIS — F331 Major depressive disorder, recurrent, moderate: Secondary | ICD-10-CM | POA: Diagnosis not present

## 2022-06-06 NOTE — Progress Notes (Signed)
   THERAPIST PROGRESS NOTE  Session Time: 9:00 am-9:45 am  Type of Therapy: Individual Therapy  Purpose of session: Shirley Ruiz will manage mood and anxiety as evidenced by increasing coping skills, expressing emotions appropriately, processing current and past events that impact her, and cope with daily stressors for 5 out of 7 days for 60 days     ProgressTowards Goals: Initial  Interventions: Therapist utilized CBT, Solution focused brief therapy, and ACT to address anxiety and depression. Therapist provided support and empathy to patient during session. Therapist worked with patient to complete a life map including: 1) what or who is most important to you, 2) What thoughts, feelings, or sensations get in the way of moving forward, 3) what do you do to move away from those difficult inner experiences, and 4) what could you do to move toward who's important to you?   Effectiveness: Patient was oriented x4 (person, place, situation, and time). Patient was casually dressed, and appropriately groomed. Patient was alert, engaged, pleasant, and cooperative. Patient got to visit her daughter two times. She feels like her daughter is doing well and thriving. She interpreted this as a failure on her part. Patient was able to identify that there are other possibilities other than she is a failure as a mother. Patient was able to identify what is important to her including: her daughter, parents, best friend, guy she has been seeing, peace, financial stability, solidifying career as an optician, empathy/sympathy, stability, independence, having an equal partner, open communication, non judgmental, confidence, and physical health. Patient feels like what is standing in the way of her moving toward what she wants is: feeling ostracized at work, past marriages, not feeling good enough, self doubt, and worry: waiting for the other shoe to drop. Patient noted that to move away from her negative inner feelings she will  try to sleep, she used to drink, binge watch a show to distract or avoid, and engage in perfectionism. Patient noted one step she could take to feel better is focusing on eating better. She feels like this would get her closer to improving physical health, improved confidence, and having control over something in her life.   Patient engaged in session. Patient responded well to interventions. Patient continues to meet criteria for Major Depressive disorder, recurrent episode, moderate and Generalized Anxiety disorder. Patient will continue in outpatient therapy due to being the least restrictive service to meet her needs. Patient made minimal progress on her goals.   Suicidal/Homicidal: Nowithout intent/plan  Plan: Return again in 2-4 weeks.  Diagnosis: MDD (major depressive disorder), recurrent episode, moderate (HCC)  GAD (generalized anxiety disorder)  Collaboration of Care: Psychiatrist AEB Dr. Merian Capron  Patient/Guardian was advised Release of Information must be obtained prior to any record release in order to collaborate their care with an outside provider. Patient/Guardian was advised if they have not already done so to contact the registration department to sign all necessary forms in order for [place] to release information regarding their care.   Consent: Patient/Guardian gives verbal consent for treatment and assignment of benefits for services provided during this visit. Patient/Guardian expressed understanding and agreed to proceed.   Glori Bickers, LCSW 06/06/2022

## 2022-06-11 ENCOUNTER — Encounter: Payer: Self-pay | Admitting: Family Medicine

## 2022-06-11 ENCOUNTER — Ambulatory Visit (INDEPENDENT_AMBULATORY_CARE_PROVIDER_SITE_OTHER): Payer: BC Managed Care – PPO | Admitting: Family Medicine

## 2022-06-11 VITALS — BP 128/74 | HR 72 | Ht 62.0 in | Wt 188.0 lb

## 2022-06-11 DIAGNOSIS — G43009 Migraine without aura, not intractable, without status migrainosus: Secondary | ICD-10-CM | POA: Diagnosis not present

## 2022-06-11 DIAGNOSIS — F332 Major depressive disorder, recurrent severe without psychotic features: Secondary | ICD-10-CM | POA: Diagnosis not present

## 2022-06-11 DIAGNOSIS — Z23 Encounter for immunization: Secondary | ICD-10-CM | POA: Diagnosis not present

## 2022-06-11 MED ORDER — TOPIRAMATE 25 MG PO TABS: 25.0000 mg | ORAL_TABLET | Freq: Two times a day (BID) | ORAL | 2 refills | Status: DC

## 2022-06-11 NOTE — Progress Notes (Signed)
   Established Patient Office Visit  Subjective   Patient ID: Shirley Ruiz, female    DOB: 1986/07/14  Age: 36 y.o. MRN: [id number]  Chief Complaint  Patient presents with   Migraine    HPI  F/U Migraines -really been struggling with her migraines over the last 2 months and getting them under control.  She is been getting 2 to 3/month.  Most of the time the Imitrex does work but it can sometimes take an hour or so to get some improved relief and even then she feels a little bit off the rest of the day.  Sometimes she is waking up with a migraine sometimes they start later in the day.  She has been under a lot more stress.  In fact she was admitted to the hospital in August after an attempt to hurt herself she was hospitalized for about 6 days but she is plugged in with psychiatrist and therapist now.    ROS    Objective:     BP 128/74   Pulse 72   Ht 5\' 2"  (1.575 m)   Wt 188 lb (85.3 kg)   SpO2 100%   BMI 34.39 kg/m    Physical Exam Vitals and nursing note reviewed.  Constitutional:      Appearance: She is well-developed.  HENT:     Head: Normocephalic and atraumatic.  Cardiovascular:     Rate and Rhythm: Normal rate and regular rhythm.     Heart sounds: Normal heart sounds.  Pulmonary:     Effort: Pulmonary effort is normal.     Breath sounds: Normal breath sounds.  Skin:    General: Skin is warm and dry.  Neurological:     Mental Status: She is alert and oriented to person, place, and time.  Psychiatric:        Behavior: Behavior normal.      No results found for any visits on 06/11/22.    The ASCVD Risk score (Arnett DK, et al., 2019) failed to calculate for the following reasons:   The 2019 ASCVD risk score is only valid for ages 65 to 28    Assessment & Plan:   Problem List Items Addressed This Visit       Cardiovascular and Mediastinum   Migraine without status migrainosus, not intractable - Primary    Well-controlled.  She really feels like  stress is currently a trigger but is working on that with her new psychiatrist.  We did discuss adding a medication such as Topamax for prophylaxis warned about potential side effects.  Follow-up in 2 to 3 months to see how she is doing and make adjustments to regimen as needed.      Relevant Medications   topiramate (TOPAMAX) 25 MG tablet     Other   MDD (major depressive disorder), recurrent severe, without psychosis ([place])    Following with psychiatry for medication management and also engaged with a therapist.      Other Visit Diagnoses     Need for immunization against influenza       Relevant Orders   Flu Vaccine QUAD 20mo+IM (Fluarix, Fluzone & Alfiuria Quad PF) (Completed)       Return in about 3 months (around 09/11/2022) for Migraines .    Shirley Lecher, MD

## 2022-06-11 NOTE — Assessment & Plan Note (Signed)
Well-controlled.  She really feels like stress is currently a trigger but is working on that with her new psychiatrist.  We did discuss adding a medication such as Topamax for prophylaxis warned about potential side effects.  Follow-up in 2 to 3 months to see how she is doing and make adjustments to regimen as needed.

## 2022-06-11 NOTE — Assessment & Plan Note (Signed)
Following with psychiatry for medication management and also engaged with a therapist.

## 2022-06-13 ENCOUNTER — Ambulatory Visit (INDEPENDENT_AMBULATORY_CARE_PROVIDER_SITE_OTHER): Payer: BC Managed Care – PPO | Admitting: Psychiatry

## 2022-06-13 ENCOUNTER — Encounter (HOSPITAL_COMMUNITY): Payer: Self-pay | Admitting: Psychiatry

## 2022-06-13 VITALS — BP 128/90 | Ht 62.0 in | Wt 185.0 lb

## 2022-06-13 DIAGNOSIS — F5102 Adjustment insomnia: Secondary | ICD-10-CM | POA: Diagnosis not present

## 2022-06-13 DIAGNOSIS — F411 Generalized anxiety disorder: Secondary | ICD-10-CM

## 2022-06-13 DIAGNOSIS — F331 Major depressive disorder, recurrent, moderate: Secondary | ICD-10-CM | POA: Diagnosis not present
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
## 2022-06-18 ENCOUNTER — Ambulatory Visit (INDEPENDENT_AMBULATORY_CARE_PROVIDER_SITE_OTHER): Payer: BC Managed Care – PPO | Admitting: Licensed Clinical Social Worker

## 2022-06-18 DIAGNOSIS — F331 Major depressive disorder, recurrent, moderate: Secondary | ICD-10-CM | POA: Diagnosis not present

## 2022-06-18 DIAGNOSIS — F411 Generalized anxiety disorder: Secondary | ICD-10-CM | POA: Diagnosis not present

## 2022-06-19 NOTE — Progress Notes (Signed)
   THERAPIST PROGRESS NOTE  Session Time: 8:00 am-8:45 am  Type of Therapy: Individual Therapy  Purpose of session: Majesti will manage mood and anxiety as evidenced by increasing coping skills, expressing emotions appropriately, processing current and past events that impact her, and cope with daily stressors for 5 out of 7 days for 60 days     ProgressTowards Goals: Progressing  Interventions: Therapist utilized CBT, Solution focused brief therapy, and ACT to address anxiety and depression. Therapist provided support and empathy to patient during session. Therapist had patient identify what has gone well since last session. Therapist worked with patient on what boosts her mood when she is down, and paying attention to her thoughts.   Effectiveness: Patient was oriented x4 (person, place, situation, and time). Patient was casually dressed, and appropriately groomed. Patient was alert, engaged, pleasant, and cooperative. Patient noted that she has focused on her physical health including changing what she eats (less sweets), started taking phetermine, and has lost 5 lbs. Patient has been able to see her daughter and has communicated with her between visits. She has been able to communicate with her between visits and did her daughter's nails the previous day. Patient's experience has been much different than the first visit. She felt like a horrible mother, a failure, etc after the first visit but the narrative has changed. She noted her daughter seems to be happy and is excited to share things with her like her new boyfriend. Patient has been doing well at work. She feels like she goes the extra mile and helps out when needed to pick up the slack. 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 She feels good about this and enjoys the positive feedback she gets from others. 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 Patient noted that when she feels down, she will listen to music, reach out to a friend, and reach out to her group of friends she met while in the hospital.   Patient engaged in session. Patient responded well to interventions. Patient continues to meet criteria for Major Depressive disorder, recurrent episode, moderate and Generalized Anxiety disorder. Patient will continue in outpatient therapy due to being the least restrictive service to meet her needs. Patient made minimal progress on her goals.   Suicidal/Homicidal: Nowithout intent/plan  Plan: Return again in 2-4 weeks.  Patient will pay attention to thoughts.   Diagnosis: MDD (major depressive disorder), recurrent episode, moderate (HCC)  GAD (generalized anxiety disorder)  Collaboration of Care: Psychiatrist AEB Dr. Merian Capron  Patient/Guardian was advised Release of Information must be obtained prior to any record release in order to collaborate their care with an outside provider. Patient/Guardian was advised if they have not already done so to contact the registration department to sign all necessary forms in order for [place] to release information regarding their care.   Consent: Patient/Guardian gives verbal consent for treatment and assignment of benefits for services provided during this visit. Patient/Guardian expressed understanding and agreed to proceed.   Glori Bickers, LCSW 06/19/2022

## 2022-06-21 ENCOUNTER — Other Ambulatory Visit (HOSPITAL_COMMUNITY): Payer: Self-pay | Admitting: Psychiatry

## 2022-07-04 ENCOUNTER — Ambulatory Visit (INDEPENDENT_AMBULATORY_CARE_PROVIDER_SITE_OTHER): Payer: BC Managed Care – PPO | Admitting: Licensed Clinical Social Worker

## 2022-07-04 DIAGNOSIS — F331 Major depressive disorder, recurrent, moderate: Secondary | ICD-10-CM | POA: Diagnosis not present

## 2022-07-04 DIAGNOSIS — F411 Generalized anxiety disorder: Secondary | ICD-10-CM
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
## 2022-07-18 ENCOUNTER — Ambulatory Visit (INDEPENDENT_AMBULATORY_CARE_PROVIDER_SITE_OTHER): Payer: BC Managed Care – PPO | Admitting: Licensed Clinical Social Worker

## 2022-07-18 DIAGNOSIS — F411 Generalized anxiety disorder: Secondary | ICD-10-CM | POA: Diagnosis not present

## 2022-07-18 DIAGNOSIS — F331 Major depressive disorder, recurrent, moderate: Secondary | ICD-10-CM

## 2022-07-19 NOTE — Progress Notes (Signed)
   THERAPIST PROGRESS NOTE  Session Time: 3:00 pm-3:45 pm  Type of Therapy: Individual Therapy  Purpose of session: Maybelle will manage mood and anxiety as evidenced by increasing coping skills, expressing emotions appropriately, processing current and past events that impact her, and cope with daily stressors for 5 out of 7 days for 60 days     ProgressTowards Goals: Progressing  Interventions: Therapist utilized CBT, Solution focused brief therapy, and ACT to address anxiety and depression. Therapist provided support and empathy to patient during session. Therapist processed patient's feelings and worries about custody court case. Therapist worked with patient to identify what she needs if court doesn't go as she hopes.   Effectiveness: Patient was oriented x4 (person, place, situation, and time). Patient was casually dressed, and appropriately groomed. Patient was alert, engaged, pleasant, and cooperative. Patient noted that things have been ok but she is worried about the upcoming court date. Patient has court coming up for custody. She doesn't have a Clinical research associate. Patient spoke with her daughter and asked when she wanted. Patient's daughter would be ok with every other week with some other days as needed. Patient asked her daughter to talk to her father about this so that court can go easily and they can get what they need. She worries that her ex won't listen to his daughter's wishes and she will lose visitation and custody. Patient has supports in place if court doesn't go well. She also noted that things at work have changed. She feels like people have started to leave her out due to their frustrations with the boss and him asking her to do additional responsibilities. She feels like this has put a target on her back but she also feels like the changes that are trying to be made at [facility] are needed.   Patient engaged in session. Patient responded well to interventions. Patient continues to meet  criteria for Major Depressive disorder, recurrent episode, moderate and Generalized Anxiety disorder. Patient will continue in outpatient therapy due to being the least restrictive service to meet her needs. Patient made minimal progress on her goals.   Suicidal/Homicidal: Nowithout intent/plan  Plan: Return again in 2-4 weeks.  Patient will focus on support from friends and family if court doesn't go as planned.   Diagnosis: MDD (major depressive disorder), recurrent episode, moderate (HCC)  GAD (generalized anxiety disorder)  Collaboration of Care: Psychiatrist AEB Dr. Thresa Ross  Patient/Guardian was advised Release of Information must be obtained prior to any record release in order to collaborate their care with an outside provider. Patient/Guardian was advised if they have not already done so to contact the registration department to sign all necessary forms in order for [place] to release information regarding their care.   Consent: Patient/Guardian gives verbal consent for treatment and assignment of benefits for services provided during this visit. Patient/Guardian expressed understanding and agreed to proceed.   Bynum Bellows, LCSW 07/19/2022

## 2022-08-01 ENCOUNTER — Other Ambulatory Visit (HOSPITAL_COMMUNITY): Payer: Self-pay | Admitting: Psychiatry

## 2022-08-08 ENCOUNTER — Ambulatory Visit (INDEPENDENT_AMBULATORY_CARE_PROVIDER_SITE_OTHER): Payer: BC Managed Care – PPO | Admitting: Licensed Clinical Social Worker

## 2022-08-08 DIAGNOSIS — F331 Major depressive disorder, recurrent, moderate: Secondary | ICD-10-CM

## 2022-08-09 NOTE — Progress Notes (Signed)
   THERAPIST PROGRESS NOTE  Session Time: 10:00 am-10:45 am  Type of Therapy: Individual Therapy  Purpose of session: Shirley Ruiz will manage mood and anxiety as evidenced by increasing coping skills, expressing emotions appropriately, processing current and past events that impact her, and cope with daily stressors for 5 out of 7 days for 60 days     ProgressTowards Goals: Progressing  Interventions: Therapist utilized CBT, Solution focused brief therapy, and ACT to address anxiety and depression. Therapist provided support and empathy to patient during session. Therapist processed patient's feelings about court. Therapist worked with patient to identify steps to manage depressive symptoms.    Effectiveness: Patient was oriented x4 (person, place, situation, and time). Patient was casually dressed, and appropriately groomed. Patient was alert, engaged, pleasant, and cooperative. Patient noted that her court date about custody turned out ok. She continues to have visitation with her daughter and it will be revisited in May. She has a court date at the end of Jan to determine child support.  She is very worried about this. Patient feels like she will have to get another job and considered giving up her parental rights to avoid paying child support. Patient feels like she could find a job a few nights a week to pay for child support. She mentioned that her boss also noted she may get a promotion and/or a raise but that is just talk at the moment. Patient has felt down due to the child support and her daughter not  being ready to spend a week at a time with her for custody. She has tried to sleep at 7pm so she doesn't have to think or feel. Patient understood that she needs to regulate sleep and experience the feelings rather than avoid the feelings.   Patient engaged in session. Patient responded well to interventions. Patient continues to meet criteria for Major Depressive disorder, recurrent episode,  moderate and Generalized Anxiety disorder. Patient will continue in outpatient therapy due to being the least restrictive service to meet her needs. Patient made minimal progress on her goals.   Suicidal/Homicidal: Nowithout intent/plan  Plan: Return again in 2-4 weeks.    Diagnosis: MDD (major depressive disorder), recurrent episode, moderate ([place])  Collaboration of Care: Psychiatrist AEB Dr. Merian Capron  Patient/Guardian was advised Release of Information must be obtained prior to any record release in order to collaborate their care with an outside provider. Patient/Guardian was advised if they have not already done so to contact the registration department to sign all necessary forms in order for [place] to release information regarding their care.   Consent: Patient/Guardian gives verbal consent for treatment and assignment of benefits for services provided during this visit. Patient/Guardian expressed understanding and agreed to proceed.   Shirley Bickers, LCSW 08/09/2022

## 2022-08-22 ENCOUNTER — Ambulatory Visit (HOSPITAL_COMMUNITY): Payer: BC Managed Care – PPO | Admitting: Licensed Clinical Social Worker

## 2022-09-05 ENCOUNTER — Ambulatory Visit (INDEPENDENT_AMBULATORY_CARE_PROVIDER_SITE_OTHER): Payer: BC Managed Care – PPO | Admitting: Licensed Clinical Social Worker

## 2022-09-05 DIAGNOSIS — F331 Major depressive disorder, recurrent, moderate: Secondary | ICD-10-CM

## 2022-09-05 DIAGNOSIS — F411 Generalized anxiety disorder: Secondary | ICD-10-CM | POA: Diagnosis not present
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
## 2022-09-12 ENCOUNTER — Telehealth (HOSPITAL_COMMUNITY): Payer: Self-pay | Admitting: Psychiatry

## 2022-09-12 ENCOUNTER — Ambulatory Visit: Payer: BC Managed Care – PPO | Admitting: Family Medicine

## 2022-09-12 ENCOUNTER — Ambulatory Visit (HOSPITAL_COMMUNITY): Payer: BC Managed Care – PPO | Admitting: Psychiatry

## 2022-09-12 NOTE — Telephone Encounter (Signed)
Message left to call back and reschedule today's appointment due to provide schedule change.

## 2022-09-12 NOTE — Progress Notes (Deleted)
   Established Patient Office Visit  Subjective   Patient ID: Shirley Ruiz, female    DOB: 1986-04-06  Age: 37 y.o. MRN: [id number]  No chief complaint on file.   HPI Follow-up migraine headaches-   {History (Optional):23778}  ROS    Objective:     There were no vitals taken for this visit. {Vitals History (Optional):23777}  Physical Exam   No results found for any visits on 09/12/22.  {Labs (Optional):23779}  The ASCVD Risk score (Arnett DK, et al., 2019) failed to calculate for the following reasons:   The 2019 ASCVD risk score is only valid for ages 14 to 38    Assessment & Plan:   Problem List Items Addressed This Visit       Cardiovascular and Mediastinum   Migraine without status migrainosus, not intractable - Primary    No follow-ups on file.    Beatrice Lecher, MD

## 2022-09-20 ENCOUNTER — Encounter (HOSPITAL_COMMUNITY): Payer: Self-pay | Admitting: Psychiatry

## 2022-09-20 ENCOUNTER — Telehealth (INDEPENDENT_AMBULATORY_CARE_PROVIDER_SITE_OTHER): Payer: BC Managed Care – PPO | Admitting: Psychiatry

## 2022-09-20 DIAGNOSIS — F40298 Other specified phobia: Secondary | ICD-10-CM | POA: Diagnosis not present

## 2022-09-20 DIAGNOSIS — F331 Major depressive disorder, recurrent, moderate: Secondary | ICD-10-CM

## 2022-09-20 DIAGNOSIS — F5102 Adjustment insomnia: Secondary | ICD-10-CM

## 2022-09-20 DIAGNOSIS — F411 Generalized anxiety disorder: Secondary | ICD-10-CM

## 2022-09-20 MED ORDER — TRAZODONE HCL 100 MG PO TABS: 100.0000 mg | ORAL_TABLET | Freq: Every evening | ORAL | 1 refills | Status: DC | PRN

## 2022-09-20 MED ORDER — HYDROXYZINE HCL 25 MG PO TABS: 25.0000 mg | ORAL_TABLET | Freq: Every day | ORAL | 0 refills | Status: DC | PRN

## 2022-09-20 MED ORDER — ARIPIPRAZOLE 10 MG PO TABS: 10.0000 mg | ORAL_TABLET | Freq: Every day | ORAL | 1 refills | Status: DC

## 2022-09-20 MED ORDER — FLUOXETINE HCL 40 MG PO CAPS: 40.0000 mg | ORAL_CAPSULE | Freq: Every day | ORAL | 1 refills | Status: DC
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
## 2022-10-03 ENCOUNTER — Ambulatory Visit (INDEPENDENT_AMBULATORY_CARE_PROVIDER_SITE_OTHER): Payer: BC Managed Care – PPO | Admitting: Licensed Clinical Social Worker

## 2022-10-03 DIAGNOSIS — F411 Generalized anxiety disorder: Secondary | ICD-10-CM

## 2022-10-03 DIAGNOSIS — F331 Major depressive disorder, recurrent, moderate: Secondary | ICD-10-CM | POA: Diagnosis not present

## 2022-10-03 NOTE — Progress Notes (Signed)
   THERAPIST PROGRESS NOTE  Session Time: 2:15 pm-3:05 pm  Type of Therapy: Individual Therapy  Purpose of session: Shirley Ruiz will manage mood and anxiety as evidenced by increasing coping skills, expressing emotions appropriately, processing current and past events that impact her, and cope with daily stressors for 5 out of 7 days for 60 days     ProgressTowards Goals: Progressing  Interventions: Therapist utilized CBT, Solution focused brief therapy, and ACT to address anxiety and depression. Therapist provided support and empathy to patient during session. Therapist administered PHQ9 and GAD7 to patient during session. Therapist explored patient's stressors. Therapist process patient's relational dynamics.   Effectiveness: Patient was oriented x4 (person, place, situation, and time). Patient was neatly dressed, and appropriately groomed. Patient was alert, engaged, pleasant, and cooperative. Patient completed a PQH9 with a score of 7 indicating mild symptoms of depression. Patient completed a GAD7 with a score of 6 indicating symptoms of mild anxiety. Patient had child support court date occurred. She will be responsible for paying $350 in child support. The courts are also requiring back pay so she will be paying an additional $50 a month. Patient's car has been having issues and will need a new head gasket which cost $1600- $1800 to repair. Her car insurance lapsed so it will cost an addition $1600 to recover her insurance. Patient was exercising consistently for 3 weeks but had some air quality issues that made it hard to breathe and got sick so she has not been exercising. Patient has performance review at work coming up in March and may get a pay raise. Patient continues to spend time with her daughter. She noted her daughter is opening up more. Patient noted that her daughter is sharing that she is smoking marijuana and drinking with friends. These are the things that her daughter stared was a  problem at patient's home and made it seem like her mother was the root cause of it but it is occurring while her daughter is at her father's home. Patient feels like she is stuck. She wants to be the parent and correct her daughter but feels like her daughter could withdraw but also doesn't want to encourage her behavior. She tells her daughter to be safe, be aware, don't drive, etc. She feels like if she does anything else it could be used against her.    Patient engaged in session. Patient responded well to interventions. Patient continues to meet criteria for Major Depressive disorder, recurrent episode, moderate and Generalized Anxiety disorder. Patient will continue in outpatient therapy due to being the least restrictive service to meet her needs. Patient made minimal progress on her goals.   Suicidal/Homicidal: Nowithout intent/plan  Plan: Return again in 2-4 weeks.    Diagnosis: MDD (major depressive disorder), recurrent episode, moderate (HCC)  GAD (generalized anxiety disorder)  Collaboration of Care: Psychiatrist AEB Dr. Merian Capron  Patient/Guardian was advised Release of Information must be obtained prior to any record release in order to collaborate their care with an outside provider. Patient/Guardian was advised if they have not already done so to contact the registration department to sign all necessary forms in order for [place] to release information regarding their care.   Consent: Patient/Guardian gives verbal consent for treatment and assignment of benefits for services provided during this visit. Patient/Guardian expressed understanding and agreed to proceed.   Glori Bickers, LCSW 10/03/2022

## 2022-10-17 ENCOUNTER — Ambulatory Visit (INDEPENDENT_AMBULATORY_CARE_PROVIDER_SITE_OTHER): Payer: BC Managed Care – PPO | Admitting: Licensed Clinical Social Worker

## 2022-10-17 DIAGNOSIS — F411 Generalized anxiety disorder: Secondary | ICD-10-CM | POA: Diagnosis not present

## 2022-10-17 DIAGNOSIS — F331 Major depressive disorder, recurrent, moderate: Secondary | ICD-10-CM

## 2022-10-18 NOTE — Progress Notes (Signed)
   THERAPIST PROGRESS NOTE  Session Time: 3:10 pm-4:00 pm  Type of Therapy: Individual Therapy  Purpose of session: Marcel will manage mood and anxiety as evidenced by increasing coping skills, expressing emotions appropriately, processing current and past events that impact her, and cope with daily stressors for 5 out of 7 days for 60 days     ProgressTowards Goals: Progressing  Interventions: Therapist utilized CBT, Solution focused brief therapy, and ACT to address anxiety and depression. Therapist provided support and empathy to patient during session. Therapist administered PHQ9 and GAD7 to patient during session. Therapist explored patient's mood and her role as mother.   Effectiveness: Patient was oriented x4 (person, place, situation, and time). Patient was neatly dressed, and appropriately groomed. Patient was alert, engaged, pleasant, and cooperative. Patient completed a PQH9 with a score of 6 indicating mild symptoms of depression. Patient completed a GAD7 with a score of 7 indicating symptoms of mild anxiety. Patient noted that work has been crazy with being short staffed. Patient also noted that her daughter continues to open up about "partying" while makes patient continue to feel stuck-doesn't want to support but also doesn't want to correct her for fear her daughter with withdrawal. Patient worries about stepping back into the mother role. Patient is trying what she can as far as being a "mother" right now.    Patient engaged in session. Patient responded well to interventions. Patient continues to meet criteria for Major Depressive disorder, recurrent episode, moderate and Generalized Anxiety disorder. Patient will continue in outpatient therapy due to being the least restrictive service to meet her needs. Patient made minimal progress on her goals.   Suicidal/Homicidal: Nowithout intent/plan  Plan: Return again in 2-4 weeks.    Diagnosis: MDD (major depressive disorder),  recurrent episode, moderate (HCC)  GAD (generalized anxiety disorder)  Collaboration of Care: Psychiatrist AEB Dr. Merian Capron  Patient/Guardian was advised Release of Information must be obtained prior to any record release in order to collaborate their care with an outside provider. Patient/Guardian was advised if they have not already done so to contact the registration department to sign all necessary forms in order for [place] to release information regarding their care.   Consent: Patient/Guardian gives verbal consent for treatment and assignment of benefits for services provided during this visit. Patient/Guardian expressed understanding and agreed to proceed.   Glori Bickers, LCSW 10/18/2022

## 2022-10-31 ENCOUNTER — Ambulatory Visit (INDEPENDENT_AMBULATORY_CARE_PROVIDER_SITE_OTHER): Payer: BC Managed Care – PPO | Admitting: Licensed Clinical Social Worker

## 2022-10-31 DIAGNOSIS — F331 Major depressive disorder, recurrent, moderate: Secondary | ICD-10-CM | POA: Diagnosis not present

## 2022-10-31 DIAGNOSIS — F411 Generalized anxiety disorder: Secondary | ICD-10-CM

## 2022-11-01 NOTE — Progress Notes (Signed)
   THERAPIST PROGRESS NOTE  Session Time: 3:00 pm-3:45 pm  Type of Therapy: Individual Therapy  Purpose of session: Tassie will manage mood and anxiety as evidenced by increasing coping skills, expressing emotions appropriately, processing current and past events that impact her, and cope with daily stressors for 5 out of 7 days for 60 days     ProgressTowards Goals: Progressing  Interventions: Therapist utilized CBT, Solution focused brief therapy, and ACT to address anxiety and depression. Therapist provided support and empathy to patient during session. Therapist administered PHQ9 and GAD7 to patient during session. Therapist processed patient's feelings about a potential job change in the future and weighing the pro's and con's of pursuing it to ease anxious thinking.   Effectiveness: Patient was oriented x4 (person, place, situation, and time). Patient was neatly dressed, and appropriately groomed. Patient was alert, engaged, pleasant, and cooperative. Patient completed a PQH9 with a score of 9 indicating mild symptoms of depression. Patient completed a GAD7 with a score of 6 indicating symptoms of mild anxiety. Patient noted that things have been ok for her. She has been considering applying for a management position when it opens up. There would be more money and more responsibility. She feels like she could add additional stress to her life at her current job or try to find another job and add additional stress from working two jobs. 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 Patient is pursuing being a optician and still has 2 years left. 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 She is going to continue to ponder it. Patient  is getting along with her daughter and continues to see her for visits.   Patient engaged in session. Patient responded well to interventions. Patient continues to meet criteria for Major Depressive disorder, recurrent episode, moderate and Generalized Anxiety disorder. Patient will continue in outpatient therapy due to being the least restrictive service to meet her needs. Patient made minimal progress on her goals.   Suicidal/Homicidal: Nowithout intent/plan  Plan: Return again in 2-4 weeks.    Diagnosis: MDD (major depressive disorder), recurrent episode, moderate (HCC)  GAD (generalized anxiety disorder)  Collaboration of Care: Psychiatrist AEB Dr. Merian Capron  Patient/Guardian was advised Release of Information must be obtained prior to any record release in order to collaborate their care with an outside provider. Patient/Guardian was advised if they have not already done so to contact the registration department to sign all necessary forms in order for [place] to release information regarding their care.   Consent: Patient/Guardian gives verbal consent for treatment and assignment of benefits for services provided during this visit. Patient/Guardian expressed understanding and agreed to proceed.   Glori Bickers, LCSW 11/01/2022

## 2022-11-14 ENCOUNTER — Ambulatory Visit (HOSPITAL_COMMUNITY): Payer: BC Managed Care – PPO | Admitting: Licensed Clinical Social Worker

## 2022-12-20 ENCOUNTER — Encounter (HOSPITAL_COMMUNITY): Payer: Self-pay | Admitting: Psychiatry

## 2022-12-20 ENCOUNTER — Telehealth (INDEPENDENT_AMBULATORY_CARE_PROVIDER_SITE_OTHER): Payer: BC Managed Care – PPO | Admitting: Psychiatry

## 2022-12-20 DIAGNOSIS — F411 Generalized anxiety disorder: Secondary | ICD-10-CM

## 2022-12-20 DIAGNOSIS — F331 Major depressive disorder, recurrent, moderate: Secondary | ICD-10-CM

## 2022-12-20 DIAGNOSIS — F5102 Adjustment insomnia: Secondary | ICD-10-CM

## 2022-12-20 DIAGNOSIS — G4700 Insomnia, unspecified: Secondary | ICD-10-CM | POA: Diagnosis not present

## 2022-12-20 MED ORDER — FLUOXETINE HCL 40 MG PO CAPS: 40.0000 mg | ORAL_CAPSULE | Freq: Every day | ORAL | 1 refills | Status: DC

## 2022-12-20 MED ORDER — HYDROXYZINE HCL 25 MG PO TABS: 25.0000 mg | ORAL_TABLET | Freq: Every day | ORAL | 0 refills | Status: DC | PRN

## 2022-12-20 MED ORDER — TRAZODONE HCL 100 MG PO TABS: 100.0000 mg | ORAL_TABLET | Freq: Every evening | ORAL | 1 refills | Status: DC | PRN

## 2022-12-20 MED ORDER — ARIPIPRAZOLE 10 MG PO TABS: 10.0000 mg | ORAL_TABLET | Freq: Every day | ORAL | 1 refills | Status: DC
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
## 2023-02-01 ENCOUNTER — Encounter: Payer: Self-pay | Admitting: Family Medicine

## 2023-02-01 DIAGNOSIS — B009 Herpesviral infection, unspecified: Secondary | ICD-10-CM

## 2023-02-03 MED ORDER — VALACYCLOVIR HCL 500 MG PO TABS
500.0000 mg | ORAL_TABLET | Freq: Every day | ORAL | 3 refills | Status: DC
Start: 2023-02-03 — End: 2024-03-15

## 2023-02-11 ENCOUNTER — Other Ambulatory Visit: Payer: Self-pay | Admitting: Family Medicine

## 2023-02-11 ENCOUNTER — Ambulatory Visit (INDEPENDENT_AMBULATORY_CARE_PROVIDER_SITE_OTHER): Payer: BC Managed Care – PPO | Admitting: Family Medicine

## 2023-02-11 VITALS — BP 133/85 | HR 78 | Resp 18 | Ht 62.0 in | Wt 178.2 lb

## 2023-02-11 DIAGNOSIS — J329 Chronic sinusitis, unspecified: Secondary | ICD-10-CM

## 2023-02-11 DIAGNOSIS — G43009 Migraine without aura, not intractable, without status migrainosus: Secondary | ICD-10-CM | POA: Diagnosis not present

## 2023-02-11 DIAGNOSIS — J40 Bronchitis, not specified as acute or chronic: Secondary | ICD-10-CM | POA: Diagnosis not present

## 2023-02-11 DIAGNOSIS — B379 Candidiasis, unspecified: Secondary | ICD-10-CM | POA: Diagnosis not present

## 2023-02-11 DIAGNOSIS — T3695XA Adverse effect of unspecified systemic antibiotic, initial encounter: Secondary | ICD-10-CM

## 2023-02-11 MED ORDER — METHYLPREDNISOLONE 4 MG PO TBPK
ORAL_TABLET | ORAL | 0 refills | Status: DC
Start: 2023-02-11 — End: 2023-06-10

## 2023-02-11 MED ORDER — FLUCONAZOLE 150 MG PO TABS: 150.0000 mg | ORAL_TABLET | Freq: Once | ORAL | 0 refills | Status: AC

## 2023-02-11 MED ORDER — HYDROCOD POLI-CHLORPHE POLI ER 10-8 MG/5ML PO SUER: 5.0000 mL | Freq: Two times a day (BID) | ORAL | 0 refills | Status: DC | PRN

## 2023-02-11 MED ORDER — ONDANSETRON HCL 8 MG PO TABS
8.0000 mg | ORAL_TABLET | Freq: Three times a day (TID) | ORAL | 0 refills | Status: DC | PRN
Start: 2023-02-11 — End: 2023-07-08

## 2023-02-11 MED ORDER — DOXYCYCLINE HYCLATE 100 MG PO TABS
100.0000 mg | ORAL_TABLET | Freq: Two times a day (BID) | ORAL | 0 refills | Status: AC
Start: 2023-02-11 — End: 2023-02-18

## 2023-02-11 MED ORDER — SUMATRIPTAN SUCCINATE 100 MG PO TABS
ORAL_TABLET | ORAL | 0 refills | Status: DC
Start: 2023-02-11 — End: 2023-02-12

## 2023-02-11 NOTE — Assessment & Plan Note (Signed)
37 year old female presents for acute visit with cough and congestion and sinus pain for about a week.  She did feel like she was getting better and then got worse over the last few days.  She is producing some yellow sputum from nasal drainage.  She has been using NyQuil regularly.  She also has a history of asthma and has noticed her cough has gotten worse and is not being covered with her inhaler.  At this point I do believe she is due for antibiotics as well as steroid as well as prescription cough medicine

## 2023-02-11 NOTE — Progress Notes (Unsigned)
Acute Office Visit  Subjective:     Patient ID: Shirley Ruiz, female    DOB: [birth year], 37 y.o.   MRN: [id number]  No chief complaint on file.   HPI Patient is in today for acute visit.  Over the past week patient has been having some coughing associated with sinus drainage that is yellowish tinged.  She has been taking NyQuil as well as allergy medication.  She is also wanting refills on her Zofran and Imitrex for migraines.  Review of Systems  Constitutional:  Negative for chills and fever.  HENT:  Positive for congestion and sinus pain.   Respiratory:  Positive for cough. Negative for shortness of breath.   Cardiovascular:  Negative for chest pain.  Neurological:  Negative for headaches.        Objective:    There were no vitals taken for this visit.   Physical Exam Vitals and nursing note reviewed.  Constitutional:      General: She is not in acute distress.    Appearance: Normal appearance.  HENT:     Head: Normocephalic and atraumatic.     Right Ear: External ear normal.     Left Ear: External ear normal.     Nose: Nose normal.  Eyes:     Conjunctiva/sclera: Conjunctivae normal.  Cardiovascular:     Rate and Rhythm: Normal rate and regular rhythm.  Pulmonary:     Effort: Pulmonary effort is normal.     Breath sounds: Normal breath sounds.  Neurological:     General: No focal deficit present.     Mental Status: She is alert and oriented to person, place, and time.  Psychiatric:        Mood and Affect: Mood normal.        Behavior: Behavior normal.        Thought Content: Thought content normal.        Judgment: Judgment normal.     No results found for any visits on 02/11/23.      Assessment & Plan:   Problem List Items Addressed This Visit       Cardiovascular and Mediastinum   Migraine without status migrainosus, not intractable   Relevant Medications   SUMAtriptan (IMITREX) 100 MG tablet   ondansetron (ZOFRAN) 8 MG tablet      Respiratory   Sinobronchitis - Primary    37 year old female presents for acute visit with cough and congestion and sinus pain for about a week.  She did feel like she was getting better and then got worse over the last few days.  She is producing some yellow sputum from nasal drainage.  She has been using NyQuil regularly.  She also has a history of asthma and has noticed her cough has gotten worse and is not being covered with her inhaler.  At this point I do believe she is due for antibiotics as well as steroid as well as prescription cough medicine      Relevant Medications   methylPREDNISolone (MEDROL DOSEPAK) 4 MG TBPK tablet   doxycycline (VIBRA-TABS) 100 MG tablet   chlorpheniramine-HYDROcodone (TUSSIONEX) 10-8 MG/5ML   fluconazole (DIFLUCAN) 150 MG tablet   Other Visit Diagnoses     Antibiotic-induced yeast infection       Relevant Medications   fluconazole (DIFLUCAN) 150 MG tablet       Meds ordered this encounter  Medications   SUMAtriptan (IMITREX) 100 MG tablet    Sig: TAKE ONE TABLET BY MOUTH EVERY 2 HOURS  AS NEEDED FOR MIGRAINE    Dispense:  10 tablet    Refill:  0   ondansetron (ZOFRAN) 8 MG tablet    Sig: Take 1 tablet (8 mg total) by mouth every 8 (eight) hours as needed. for nausea    Dispense:  12 tablet    Refill:  0   methylPREDNISolone (MEDROL DOSEPAK) 4 MG TBPK tablet    Sig: Take as directed on pill pack    Dispense:  21 tablet    Refill:  0   doxycycline (VIBRA-TABS) 100 MG tablet    Sig: Take 1 tablet (100 mg total) by mouth 2 (two) times daily for 7 days.    Dispense:  14 tablet    Refill:  0   chlorpheniramine-HYDROcodone (TUSSIONEX) 10-8 MG/5ML    Sig: Take 5 mLs by mouth every 12 (twelve) hours as needed for cough (cough, will cause drowsiness.).    Dispense:  120 mL    Refill:  0   fluconazole (DIFLUCAN) 150 MG tablet    Sig: Take 1 tablet (150 mg total) by mouth once for 1 dose.    Dispense:  1 tablet    Refill:  0      Charlton Amor,  DO

## 2023-02-12 ENCOUNTER — Encounter: Payer: Self-pay | Admitting: Family Medicine

## 2023-03-28 ENCOUNTER — Encounter (HOSPITAL_COMMUNITY): Payer: Self-pay | Admitting: Psychiatry

## 2023-03-28 ENCOUNTER — Telehealth (INDEPENDENT_AMBULATORY_CARE_PROVIDER_SITE_OTHER): Payer: BC Managed Care – PPO | Admitting: Psychiatry

## 2023-03-28 DIAGNOSIS — F5102 Adjustment insomnia: Secondary | ICD-10-CM | POA: Diagnosis not present

## 2023-03-28 DIAGNOSIS — F332 Major depressive disorder, recurrent severe without psychotic features: Secondary | ICD-10-CM

## 2023-03-28 DIAGNOSIS — F331 Major depressive disorder, recurrent, moderate: Secondary | ICD-10-CM

## 2023-03-28 DIAGNOSIS — F40298 Other specified phobia: Secondary | ICD-10-CM

## 2023-03-28 DIAGNOSIS — F411 Generalized anxiety disorder: Secondary | ICD-10-CM

## 2023-03-28 MED ORDER — TRAZODONE HCL 100 MG PO TABS: 100.0000 mg | ORAL_TABLET | Freq: Every evening | ORAL | 2 refills | Status: DC | PRN

## 2023-03-28 MED ORDER — ARIPIPRAZOLE 10 MG PO TABS: 10.0000 mg | ORAL_TABLET | Freq: Every day | ORAL | 2 refills | Status: DC

## 2023-03-28 MED ORDER — FLUOXETINE HCL 40 MG PO CAPS: 40.0000 mg | ORAL_CAPSULE | Freq: Every day | ORAL | 2 refills | Status: DC
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
## 2023-06-10 ENCOUNTER — Encounter: Payer: Self-pay | Admitting: Obstetrics and Gynecology

## 2023-06-10 ENCOUNTER — Ambulatory Visit: Payer: BC Managed Care – PPO | Admitting: Obstetrics and Gynecology

## 2023-06-10 ENCOUNTER — Other Ambulatory Visit (HOSPITAL_COMMUNITY)
Admission: RE | Admit: 2023-06-10 | Discharge: 2023-06-10 | Disposition: A | Discharge status: Home / Self Care | Payer: BC Managed Care – PPO | Source: Ambulatory Visit | Attending: Obstetrics and Gynecology | Admitting: Obstetrics and Gynecology

## 2023-06-10 VITALS — BP 147/86 | HR 81 | Resp 16 | Ht 62.0 in | Wt 173.0 lb

## 2023-06-10 DIAGNOSIS — Z113 Encounter for screening for infections with a predominantly sexual mode of transmission: Secondary | ICD-10-CM | POA: Diagnosis not present

## 2023-06-10 DIAGNOSIS — Z01419 Encounter for gynecological examination (general) (routine) without abnormal findings: Secondary | ICD-10-CM | POA: Insufficient documentation

## 2023-06-10 DIAGNOSIS — Z1151 Encounter for screening for human papillomavirus (HPV): Secondary | ICD-10-CM | POA: Diagnosis not present

## 2023-06-10 NOTE — Progress Notes (Signed)
GYNECOLOGY ANNUAL PREVENTATIVE CARE ENCOUNTER NOTE  History:     Shirley Ruiz is a 37 y.o. G4P1001 female here for a routine annual gynecologic exam.  Current complaints: None.   Denies abnormal vaginal bleeding, discharge, pelvic pain, problems with intercourse or other gynecologic concerns.    Gynecologic History No LMP recorded. (Menstrual status: IUD). Contraception: none Requests IUD removal today.  Last Pap: 2021. Result was normal with negative HPV Last Mammogram: NA.  Result was normal  Last Colonoscopy: NA age.   Obstetric History OB History  Gravida Para Term Preterm AB Living  1 1 1     1   SAB IAB Ectopic Multiple Live Births               # Outcome Date GA Lbr Len/2nd Weight Sex Type Anes PTL Lv  1 Term             Past Medical History:  Diagnosis Date   Abnormal Pap smear 01/2009   .HGSIL, Colpo and Cryo   Anxiety    Asthma    Depression    Diabetes in pregnancy    Gestational   HSV-1 (herpes simplex virus 1) infection    HSV-2 (herpes simplex virus 2) infection    Intervertebral disc protrusion    Mental disorder    H/O depression   Migraines    PVC (premature ventricular contraction)     Past Surgical History:  Procedure Laterality Date   CRYOTHERAPY     For cervical dysplasia   MOUTH SURGERY     Cut in mouth to remove an extra tooth    Current Outpatient Medications on File Prior to Visit  Medication Sig Dispense Refill   ARIPiprazole (ABILIFY) 10 MG tablet Take 1 tablet (10 mg total) by mouth daily. 30 tablet 2   FLUoxetine (PROZAC) 40 MG capsule Take 1 capsule (40 mg total) by mouth daily. 30 capsule 2   hydrOXYzine (ATARAX) 25 MG tablet Take 1 tablet (25 mg total) by mouth daily as needed for anxiety. 30 tablet 0   ondansetron (ZOFRAN) 8 MG tablet Take 1 tablet (8 mg total) by mouth every 8 (eight) hours as needed. for nausea 12 tablet 0   SUMAtriptan (IMITREX) 100 MG tablet TAKE 1 TABLET BY MOUTH EVERY 2 HOURS AS NEEDED FOR MIGRAINE  HEADACHE max two pills a day 10 tablet 0   topiramate (TOPAMAX) 25 MG tablet Take 1 tablet (25 mg total) by mouth 2 (two) times daily. 60 tablet 2   traZODone (DESYREL) 100 MG tablet Take 1 tablet (100 mg total) by mouth at bedtime as needed. for sleep 30 tablet 2   valACYclovir (VALTREX) 500 MG tablet Take 1 tablet (500 mg total) by mouth daily. 90 tablet 3   albuterol (VENTOLIN HFA) 108 (90 Base) MCG/ACT inhaler Inhale 2 puffs into the lungs every 6 (six) hours as needed for wheezing or shortness of breath. (Patient not taking: Reported on 06/10/2023) 18 g 1   chlorpheniramine-HYDROcodone (TUSSIONEX) 10-8 MG/5ML Take 5 mLs by mouth every 12 (twelve) hours as needed for cough (cough, will cause drowsiness.). (Patient not taking: Reported on 06/10/2023) 120 mL 0   esomeprazole (NEXIUM) 20 MG capsule Take 20 mg by mouth daily. (Patient not taking: Reported on 06/10/2023)     phentermine (ADIPEX-P) 37.5 MG tablet Take 37.5 mg by mouth 2 (two) times daily. (Patient not taking: Reported on 06/10/2023)     traMADol (ULTRAM) 50 MG tablet Take 50 mg by mouth  daily as needed. (Patient not taking: Reported on 06/10/2023)     No current facility-administered medications on file prior to visit.    Allergies  Allergen Reactions   Bupropion Other (See Comments) and Rash    Increased PVCs Increased PVCs Increased PVCs Increased PVCs   Penicillins Hives   Gabapentin Other (See Comments)    Sleep paraleipsis     Social History:  reports that she has never smoked. She has never used smokeless tobacco. She reports current alcohol use of about 2.0 standard drinks of alcohol per week. She reports that she does not currently use drugs after having used the following drugs: Marijuana.  Family History  Problem Relation Age of Onset   Depression Mother    Hypertension Mother    Heart disease Mother    Heart attack Father    Healthy Sister    Depression Maternal Grandmother    Heart disease Maternal  Grandfather     The following portions of the patient's history were reviewed and updated as appropriate: allergies, current medications, past family history, past medical history, past social history, past surgical history and problem list.  Review of Systems Pertinent items noted in HPI and remainder of comprehensive ROS otherwise negative.  Physical Exam:  BP (!) 147/86   Pulse 81   Resp 16   Ht 5\' 2"  (1.575 m)   Wt 173 lb (78.5 kg)   BMI 31.64 kg/m  CONSTITUTIONAL: Well-developed, well-nourished female in no acute distress.  HENT:  Normocephalic, atraumatic, External right and left ear normal.  EYES: Conjunctivae and EOM are normal. Pupils are equal, round, and reactive to light. No scleral icterus.  NECK: Normal range of motion, supple, no masses.  Normal thyroid.  SKIN: Skin is warm and dry. No rash noted. Not diaphoretic. No erythema. No pallor. MUSCULOSKELETAL: Normal range of motion. No tenderness.  No cyanosis, clubbing, or edema. NEUROLOGIC: Alert and oriented to person, place, and time. Normal reflexes, muscle tone coordination.  PSYCHIATRIC: Normal mood and affect. Normal behavior. Normal judgment and thought content. CARDIOVASCULAR: Normal heart rate noted, regular rhythm RESPIRATORY: Clear to auscultation bilaterally. Effort and breath sounds normal, no problems with respiration noted. BREASTS: Symmetric in size. No masses, tenderness, skin changes, nipple drainage, or lymphadenopathy bilaterally. Performed in the presence of a chaperone. ABDOMEN: Soft, no distention noted.  No tenderness, rebound or guarding.  PELVIC: Normal appearing external genitalia and urethral meatus; normal appearing vaginal mucosa and cervix.  No abnormal vaginal discharge noted.  Pap smear obtained. IUD removed without difficulty.   Normal uterine size, no other palpable masses, no uterine or adnexal tenderness.  Performed in the presence of a chaperone. Pap smear collected by Student PA Brooke  McNees.    Assessment and Plan:   1. Well woman exam  - Cytology - PAP( [place]) - MM Digital Screening; Future - Elevated BP today, follow up with PCP   2. Screening examination for STI  - Cytology - PAP( [place])    Will follow up results of pap smear and manage accordingly. Normal breast examination today, she was advised to perform periodic self breast examinations.  Mammogram scheduled for breast cancer screening. Routine preventative health maintenance measures emphasized. Please refer to After Visit Summary for other counseling recommendations.   Princess Karnes, Harolyn Rutherford, NP [facility] for [facility], [facility]     IUD Removal   Patient was in the dorsal lithotomy position, normal external genitalia was noted.  A speculum  was placed in the patient's vagina, normal discharge was noted, no lesions. The multiparous cervix was visualized, no lesions, no abnormal discharge.  The strings of the IUD were grasped and pulled using ring forceps. The IUD was removed in its entirety. Patient tolerated the procedure well.     Shelda Truby, Harolyn Rutherford, NP

## 2023-06-17 LAB — CYTOLOGY - PAP
Adequacy: ABSENT
Chlamydia: NEGATIVE
Comment: NEGATIVE
Comment: NEGATIVE
Comment: NEGATIVE
Comment: NORMAL
Diagnosis: NEGATIVE
High risk HPV: NEGATIVE
Neisseria Gonorrhea: NEGATIVE
Trichomonas: NEGATIVE

## 2023-07-08 ENCOUNTER — Other Ambulatory Visit: Payer: Self-pay | Admitting: Family Medicine

## 2023-07-08 DIAGNOSIS — G43009 Migraine without aura, not intractable, without status migrainosus: Secondary | ICD-10-CM

## 2023-07-23 ENCOUNTER — Telehealth (HOSPITAL_COMMUNITY): Payer: BC Managed Care – PPO | Admitting: Psychiatry

## 2023-07-23 ENCOUNTER — Encounter (HOSPITAL_COMMUNITY): Payer: Self-pay | Admitting: Psychiatry

## 2023-07-23 DIAGNOSIS — F411 Generalized anxiety disorder: Secondary | ICD-10-CM

## 2023-07-23 DIAGNOSIS — F331 Major depressive disorder, recurrent, moderate: Secondary | ICD-10-CM | POA: Diagnosis not present

## 2023-07-23 DIAGNOSIS — F5102 Adjustment insomnia: Secondary | ICD-10-CM | POA: Diagnosis not present

## 2023-07-23 MED ORDER — HYDROXYZINE HCL 25 MG PO TABS: 25.0000 mg | ORAL_TABLET | Freq: Every day | ORAL | 0 refills | Status: AC | PRN

## 2023-07-23 MED ORDER — FLUOXETINE HCL 40 MG PO CAPS: 40.0000 mg | ORAL_CAPSULE | Freq: Every day | ORAL | 2 refills | Status: DC

## 2023-07-23 MED ORDER — TRAZODONE HCL 100 MG PO TABS: 100.0000 mg | ORAL_TABLET | Freq: Every evening | ORAL | 2 refills | Status: AC | PRN

## 2023-07-23 MED ORDER — ARIPIPRAZOLE 10 MG PO TABS: 10.0000 mg | ORAL_TABLET | Freq: Every day | ORAL | 2 refills | Status: DC
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
## 2023-11-27 ENCOUNTER — Other Ambulatory Visit (HOSPITAL_COMMUNITY)
Admission: RE | Admit: 2023-11-27 | Discharge: 2023-11-27 | Disposition: A | Discharge status: Home / Self Care | Source: Ambulatory Visit | Attending: Obstetrics and Gynecology | Admitting: Obstetrics and Gynecology

## 2023-11-27 ENCOUNTER — Encounter: Payer: Self-pay | Admitting: Obstetrics and Gynecology

## 2023-11-27 ENCOUNTER — Ambulatory Visit: Admitting: Obstetrics and Gynecology

## 2023-11-27 VITALS — BP 166/109 | HR 86 | Ht 62.0 in | Wt 176.0 lb

## 2023-11-27 DIAGNOSIS — N760 Acute vaginitis: Secondary | ICD-10-CM

## 2023-11-27 DIAGNOSIS — N898 Other specified noninflammatory disorders of vagina: Secondary | ICD-10-CM | POA: Insufficient documentation

## 2023-11-27 DIAGNOSIS — B9689 Other specified bacterial agents as the cause of diseases classified elsewhere: Secondary | ICD-10-CM

## 2023-11-27 DIAGNOSIS — B3731 Acute candidiasis of vulva and vagina: Secondary | ICD-10-CM

## 2023-11-27 MED ORDER — FLUCONAZOLE 150 MG PO TABS: 150.0000 mg | ORAL_TABLET | ORAL | 3 refills | Status: DC

## 2023-11-27 NOTE — Addendum Note (Signed)
 Addended by: Fulton Job, Neomi Laidler  L on: 11/27/2023 01:45 PM   Modules accepted: Orders

## 2023-11-27 NOTE — Progress Notes (Signed)
   GYNECOLOGY OFFICE VISIT NOTE  History:   Shirley Ruiz is a 38 y.o. G1P1001 here today for vaginal discharge.  Pap normal 06/2023 and HPV negative. Fungal organisms noted on pap at that time but no reported symptoms.    IUD removed at that time as well.   Since removal of IUD and partner's vasectomy has had monthly vaginal discharge that is usually clumpy and white, sometimes with yellow-green tinge. Not always after her cycle. Sometimes before her cycle. Has tried OTC measures which usually seem to help but keeps coming back.     The following portions of the patient's history were reviewed and updated as appropriate: allergies, current medications, past family history, past medical history, past social history, past surgical history and problem list.   Health Maintenance:   Normal pap and negative HRHPV on 06/10/2023.   Diagnosis  Date Value Ref Range Status  06/10/2023   Final   - Negative for intraepithelial lesion or malignancy (NILM)    Review of Systems:  Pertinent items noted in HPI and remainder of comprehensive ROS otherwise negative.  Physical Exam:  BP (!) 166/109   Pulse 86   Ht 5\' 2"  (1.575 m)   Wt 176 lb (79.8 kg)   LMP 11/13/2023   BMI 32.19 kg/m  CONSTITUTIONAL: Well-developed, well-nourished female in no acute distress.  HEENT:  Normocephalic, atraumatic. External right and left ear normal. No scleral icterus.  NECK: Normal range of motion, supple, no masses noted on observation SKIN: No rash noted. Not diaphoretic. No erythema. No pallor. MUSCULOSKELETAL: Normal range of motion. No edema noted. NEUROLOGIC: Alert and oriented to person, place, and time. Normal muscle tone coordination. No cranial nerve deficit noted. PSYCHIATRIC: Normal mood and affect. Normal behavior. Normal judgment and thought content.  PELVIC: Normal appearing external genitalia; normal urethral meatus; normal appearing vaginal mucosa and cervix.  Thick yellow clump discharge on  vaginal walls. Performed in the presence of a chaperone  Labs and Imaging No results found for this or any previous visit (from the past week). No results found.  Assessment and Plan:   1. Vaginal discharge (Primary) - Cultures done today - Discussed possible suppression - Discussed possible glabrata - Will do diflucan  while awaiting results.     Meds ordered this encounter  Medications   fluconazole  (DIFLUCAN ) 150 MG tablet    Sig: Take 1 tablet (150 mg total) by mouth every 3 (three) days. For three doses    Dispense:  3 tablet    Refill:  3     Routine preventative health maintenance measures emphasized. Please refer to After Visit Summary for other counseling recommendations.   Return if symptoms worsen or fail to improve.  Lacey Pian, MD, FACOG Obstetrician & Gynecologist, [facility] for [facility], [facility]

## 2023-11-28 ENCOUNTER — Encounter: Payer: Self-pay | Admitting: Obstetrics and Gynecology

## 2023-11-28 LAB — RPR: RPR Ser Ql: NONREACTIVE

## 2023-11-28 LAB — HEPATITIS B SURFACE ANTIGEN: Hepatitis B Surface Ag: NEGATIVE

## 2023-11-28 LAB — CERVICOVAGINAL ANCILLARY ONLY
Bacterial Vaginitis (gardnerella): POSITIVE — AB
Candida Glabrata: NEGATIVE
Candida Vaginitis: POSITIVE — AB
Chlamydia: NEGATIVE
Comment: NEGATIVE
Comment: NEGATIVE
Comment: NEGATIVE
Comment: NEGATIVE
Comment: NEGATIVE
Comment: NORMAL
Neisseria Gonorrhea: NEGATIVE
Trichomonas: NEGATIVE

## 2023-11-28 LAB — HEPATITIS C ANTIBODY: Hep C Virus Ab: NONREACTIVE

## 2023-11-28 LAB — HIV ANTIBODY (ROUTINE TESTING W REFLEX): HIV Screen 4th Generation wRfx: NONREACTIVE

## 2023-11-28 MED ORDER — METRONIDAZOLE 500 MG PO TABS: 500.0000 mg | ORAL_TABLET | Freq: Two times a day (BID) | ORAL | 0 refills | Status: DC

## 2023-11-28 NOTE — Addendum Note (Signed)
 Addended by: Lacey Pian A on: 11/28/2023 05:51 PM   Modules accepted: Orders

## 2024-01-13 ENCOUNTER — Ambulatory Visit: Admitting: Family Medicine

## 2024-01-13 ENCOUNTER — Ambulatory Visit: Payer: Self-pay

## 2024-01-13 DIAGNOSIS — R079 Chest pain, unspecified: Secondary | ICD-10-CM | POA: Diagnosis not present

## 2024-01-13 DIAGNOSIS — R0602 Shortness of breath: Secondary | ICD-10-CM | POA: Diagnosis not present

## 2024-01-13 DIAGNOSIS — R0789 Other chest pain: Secondary | ICD-10-CM | POA: Diagnosis not present

## 2024-01-13 DIAGNOSIS — R072 Precordial pain: Secondary | ICD-10-CM | POA: Diagnosis not present

## 2024-01-13 DIAGNOSIS — Z880 Allergy status to penicillin: Secondary | ICD-10-CM | POA: Diagnosis not present

## 2024-01-13 DIAGNOSIS — I160 Hypertensive urgency: Secondary | ICD-10-CM | POA: Diagnosis not present

## 2024-01-13 DIAGNOSIS — R030 Elevated blood-pressure reading, without diagnosis of hypertension: Secondary | ICD-10-CM | POA: Diagnosis not present

## 2024-01-13 DIAGNOSIS — F32A Depression, unspecified: Secondary | ICD-10-CM | POA: Diagnosis not present

## 2024-01-13 DIAGNOSIS — Z888 Allergy status to other drugs, medicaments and biological substances status: Secondary | ICD-10-CM | POA: Diagnosis not present

## 2024-01-13 DIAGNOSIS — Z79899 Other long term (current) drug therapy: Secondary | ICD-10-CM | POA: Diagnosis not present

## 2024-01-13 DIAGNOSIS — F419 Anxiety disorder, unspecified: Secondary | ICD-10-CM | POA: Diagnosis not present

## 2024-01-13 DIAGNOSIS — R9431 Abnormal electrocardiogram [ECG] [EKG]: Secondary | ICD-10-CM | POA: Diagnosis not present

## 2024-01-13 NOTE — Telephone Encounter (Signed)
 This RN attempted to contact patient pertaining to her high BP. (1st attempt) [phone]. No answer. LVM. Will route to call backs.      Copied from CRM [id number]. Topic: Clinical - Medical Advice >> Jan 13, 2024  9:29 AM Danelle Dunning F wrote: Reason for CRM:    Patient called in from the Urgent Care; Stating that she took [facility] blood [facility] and got a reading of 194/107 and decided to go into the urgent care and not pursue her upcoming appointment; Patient wanted the office to be aware that once she arrived at the UC her reading was 198/115. Patient is currently at [facility] in [place].   Callback Number: [phone]

## 2024-01-13 NOTE — Telephone Encounter (Signed)
 3rd attempt - no answer, LVM. Will route to office for follow up.

## 2024-01-13 NOTE — Telephone Encounter (Signed)
 This RN attempted 2nd attempt to contact patient. No answer. LVM.

## 2024-01-14 DIAGNOSIS — R0789 Other chest pain: Secondary | ICD-10-CM | POA: Diagnosis not present

## 2024-01-14 DIAGNOSIS — I10 Essential (primary) hypertension: Secondary | ICD-10-CM | POA: Diagnosis not present

## 2024-01-15 ENCOUNTER — Encounter: Payer: Self-pay | Admitting: Family Medicine

## 2024-01-15 ENCOUNTER — Ambulatory Visit: Admitting: Family Medicine

## 2024-01-15 VITALS — BP 158/100 | HR 81 | Ht 62.0 in | Wt 179.7 lb

## 2024-01-15 DIAGNOSIS — E876 Hypokalemia: Secondary | ICD-10-CM | POA: Diagnosis not present

## 2024-01-15 DIAGNOSIS — I10 Essential (primary) hypertension: Secondary | ICD-10-CM | POA: Diagnosis not present

## 2024-01-15 DIAGNOSIS — R0789 Other chest pain: Secondary | ICD-10-CM

## 2024-01-15 DIAGNOSIS — J0100 Acute maxillary sinusitis, unspecified: Secondary | ICD-10-CM

## 2024-01-15 DIAGNOSIS — Z8249 Family history of ischemic heart disease and other diseases of the circulatory system: Secondary | ICD-10-CM | POA: Diagnosis not present

## 2024-01-15 MED ORDER — DOXYCYCLINE HYCLATE 100 MG PO TABS: 100.0000 mg | ORAL_TABLET | Freq: Two times a day (BID) | ORAL | 0 refills | Status: DC

## 2024-01-15 MED ORDER — LOSARTAN POTASSIUM 25 MG PO TABS: 25.0000 mg | ORAL_TABLET | Freq: Every day | ORAL | 0 refills | Status: DC

## 2024-01-15 NOTE — Patient Instructions (Signed)
 Send me blood pressures over MyChart on Monday

## 2024-01-15 NOTE — Progress Notes (Signed)
 Established Patient Office Visit  Subjective  Patient ID: Shirley Ruiz, female    DOB: 04-May-1986  Age: 38 y.o. MRN: [id number]  Chief Complaint  Patient presents with   Follow-up    Bp     HPI Shirley Ruiz is a pleasant 38 year old female with a history of migraines, palpitations, MDD and Panic disorder is here for an ED follow up. She presented to the ED on 6/10 with Hypertension and associated chest pressure, blurred vision, dizziness and headache. She states she woke up that day not feeling right. She notes that it was going to be a stressful day at work as she was having to fill in for someone who called out but states that she does not think this was the cause. The chest pressure, vision changes dizziness and headache started around 7AM when she got to work. As she got out of the car to walk in to work, she noticed that she was short of breath. Her symptoms did not feel like her palpitations or anxiety. Her symptoms did not improve with rest. She took her BP twice that morning the first reading was 190/100 and the second was 230/180. Shortly after, she decided to take herself to the Emergency department around 8-8:30 AM.  The Emergency Department note is not accessible but the patient states that she had two EKGs; the first had some small abnormality but the second was normal.  She states also had a CBC and CMP and all I remember is that the Potassium was low. She was consulted by Cardiology while in the hospital and a stress test was scheduled for her in 4 weeks. She  started on amlodipine 5mg  in the ED and has taken it daily since.   She states that over the past couple months she has had pressure in her chest occasionally and there is no particular trigger for it. She also states feeling flushed three times in the past few months but this has only occurred when she is particularly stressed or anxious. She has started going on walks with her fiance and noticed she is winded/fatigued  after walking up a hill but attributes this to being out of shape.  Today, Shirley Ruiz is still experiencing some mild chest pressure. She is also concerned because she has had congestion and pressure in the sinuses for the past month. She has not taken any decongestants or Flonase  for this. Shirley Ruiz notes no new medications, supplements, energy drinks or foods. She tapered herself off of Prozac  and Abilify  in November but states that her anxiety and depression have actually been better since doing this. She denies fever, palpitations and swelling in the extremities.   She has a strong family history of cardiovascular disease. Her mother has HTN and some heart disease that I can't remember the name of. Her father had an MI in his 44s. Her maternal grandfather also had an MI.  Review of Systems  Constitutional:  Negative for chills, fever and malaise/fatigue.  HENT:  Positive for congestion and sinus pain. Negative for ear discharge, ear pain and sore throat.   Respiratory:  Negative for hemoptysis, sputum production and shortness of breath.   Cardiovascular:  Negative for palpitations and leg swelling.       Mild chest pressure.      Objective:     BP (!) 158/100   Pulse 81   Ht 5' 2 (1.575 m)   Wt 179 lb 11.2 oz (81.5 kg)   SpO2 100%  BMI 32.87 kg/m     Physical Exam Vitals and nursing note reviewed.  Constitutional:      Appearance: Normal appearance.  HENT:     Head: Normocephalic.   Eyes:     Conjunctiva/sclera: Conjunctivae normal.    Cardiovascular:     Rate and Rhythm: Normal rate and regular rhythm.  Pulmonary:     Effort: Pulmonary effort is normal.     Breath sounds: Normal breath sounds.   Skin:    General: Skin is warm and dry.   Neurological:     Mental Status: She is alert.   Psychiatric:        Mood and Affect: Mood normal.      No results found for any visits on 01/15/24.     The ASCVD Risk score (Arnett DK, et al., 2019) failed to calculate  for the following reasons:   The 2019 ASCVD risk score is only valid for ages 12 to 54    Assessment & Plan:   Problem List Items Addressed This Visit       Cardiovascular and Mediastinum   Essential hypertension - Primary   BP not well controlled on amlodipine but hasn't been on it but for 3 days. Will add Losartan.        Relevant Medications   amLODipine (NORVASC) 5 MG tablet   losartan (COZAAR) 25 MG tablet   Other Relevant Orders   Lipid panel   TSH   Other Visit Diagnoses       Family history of heart disease       Relevant Orders   Lipid panel   TSH     Hypokalemia       Relevant Orders   Potassium     Acute non-recurrent maxillary sinusitis       Relevant Medications   doxycycline  (VIBRA -TABS) 100 MG tablet     Atypical chest pain           Acute sinusitis-she has had symptoms for greater than 1 month with nasal pressure over the maxillary sinuses drainage and yellow congestion.  Will go ahead and treat with doxycycline  and also recommend that she start Flonase  or Nasonex over-the-counter she has used them before but never consistently.  Encouraged her to use it for at least 2 weeks to try to help improve her nasal symptoms.   Atypical chest pain-she is also been having some atypical chest pain but does have a follow-up with cardiology in 4 weeks for stress test but they want a get her blood pressure under control first.  Also with her known family history I think we should do further workup including a lipid panel. Will check TSH.    Return in about 2 weeks (around 01/29/2024) for Hypertension.    Duaine German, MD

## 2024-01-15 NOTE — Assessment & Plan Note (Signed)
 BP not well controlled on amlodipine but hasn't been on it but for 3 days. Will add Losartan.

## 2024-01-16 ENCOUNTER — Ambulatory Visit: Payer: Self-pay | Admitting: Family Medicine

## 2024-01-16 LAB — TSH: TSH: 1.04 u[IU]/mL (ref 0.450–4.500)

## 2024-01-16 LAB — LIPID PANEL
Chol/HDL Ratio: 2.5 ratio (ref 0.0–4.4)
Cholesterol, Total: 174 mg/dL (ref 100–199)
HDL: 70 mg/dL (ref >39)
LDL Chol Calc (NIH): 91 mg/dL (ref 0–99)
Triglycerides: 70 mg/dL (ref 0–149)
VLDL Cholesterol Cal: 13 mg/dL (ref 5–40)

## 2024-01-16 LAB — POTASSIUM: Potassium: 4.7 mmol/L (ref 3.5–5.2)

## 2024-01-16 NOTE — Progress Notes (Signed)
 Hi Shirley Ruiz, labs look good including repeat potassium and thyroid level.  Hopefully the addition of the new blood pressure pill to the amlodipine will be helpful.  Take a look at that DASH diet to and try to make a few little changes that might be helpful

## 2024-01-19 ENCOUNTER — Encounter: Payer: Self-pay | Admitting: Family Medicine

## 2024-01-21 ENCOUNTER — Encounter (HOSPITAL_COMMUNITY): Payer: Self-pay

## 2024-01-21 ENCOUNTER — Telehealth (HOSPITAL_COMMUNITY): Payer: BC Managed Care – PPO | Admitting: Psychiatry

## 2024-01-29 ENCOUNTER — Encounter: Payer: Self-pay | Admitting: Family Medicine

## 2024-01-29 ENCOUNTER — Ambulatory Visit: Admitting: Family Medicine

## 2024-01-29 VITALS — BP 125/82 | HR 84 | Ht 62.0 in | Wt 180.1 lb

## 2024-01-29 DIAGNOSIS — F4323 Adjustment disorder with mixed anxiety and depressed mood: Secondary | ICD-10-CM

## 2024-01-29 DIAGNOSIS — I10 Essential (primary) hypertension: Secondary | ICD-10-CM | POA: Diagnosis not present

## 2024-01-29 MED ORDER — LOSARTAN POTASSIUM 25 MG PO TABS: 25.0000 mg | ORAL_TABLET | Freq: Every day | ORAL | 1 refills | Status: DC

## 2024-01-29 NOTE — Progress Notes (Signed)
 Established Patient Office Visit  Subjective  Patient ID: Shirley Ruiz, female    DOB: 31-Dec-1985  Age: 38 y.o. MRN: [id number]  Chief Complaint  Patient presents with   Hypertension    HPI  Hypertension- Pt denies chest pain, SOB, dizziness, or heart palpitations.  Taking meds as directed w/o problems.  Denies medication side effects.  She did not realize she was supposed to go up on the losartan  to 2 tabs but did not see the note until today.  She has not really had a lot of time to make dietary changes but she and her husband have committed to going to the grocery store this weekend and really working on bringing some foods in the house that are good for lowering blood pressure she also knows she needs to start exercising more consistently as that will help as well.  Does still have her follow-up scheduled with cardiology in about 2 weeks for the chest pain.  It has been a little bit better.    ROS    Objective:     BP 125/82   Pulse 84   Ht 5' 2 (1.575 m)   Wt 180 lb 1.9 oz (81.7 kg)   SpO2 97%   BMI 32.94 kg/m    Physical Exam Vitals and nursing note reviewed.  Constitutional:      Appearance: Normal appearance.  HENT:     Head: Normocephalic and atraumatic.   Eyes:     Conjunctiva/sclera: Conjunctivae normal.    Cardiovascular:     Rate and Rhythm: Normal rate and regular rhythm.  Pulmonary:     Effort: Pulmonary effort is normal.     Breath sounds: Normal breath sounds.   Skin:    General: Skin is warm and dry.   Neurological:     Mental Status: She is alert.   Psychiatric:        Mood and Affect: Mood normal.      No results found for any visits on 01/29/24.    The ASCVD Risk score (Arnett DK, et al., 2019) failed to calculate for the following reasons:   The 2019 ASCVD risk score is only valid for ages 23 to 43    Assessment & Plan:   Problem List Items Addressed This Visit       Cardiovascular and Mediastinum   Essential  hypertension   Pressure today is at 138/77 looks much better than in the 150s.  She is tolerating the losartan  and the amlodipine well.  We discussed that I would really like to see her blood pressure in the 120s and if we can do that we can keep the losartan  but start working on discontinuing the amlodipine if over the next couple of months she is doing really well with dietary and exercise changes to reduce her pressures.  We had discussed the DASH diet previously.  Though she is on the amlodipine also for Raynaud's.        Relevant Medications   losartan  (COZAAR ) 25 MG tablet     Other   Adjustment disorder with mixed anxiety and depressed mood - Primary   She is really not having any depressive symptoms currently.  Really just some anxiety symptoms.  GAD-7 score of 11 today and she says that is pretty much driven by work.  They have been short staffed and that we having somebody come back pretty soon and so she is actually hoping that will alleviate some of that anxiety and stress.  But home life is good right now.       Return in about 5 months (around 06/21/2024) for Hypertension.    Dorothyann Byars, MD

## 2024-01-29 NOTE — Assessment & Plan Note (Signed)
 She is really not having any depressive symptoms currently.  Really just some anxiety symptoms.  GAD-7 score of 11 today and she says that is pretty much driven by work.  They have been short staffed and that we having somebody come back pretty soon and so she is actually hoping that will alleviate some of that anxiety and stress.  But home life is good right now.

## 2024-01-29 NOTE — Assessment & Plan Note (Addendum)
 Pressure today is at 138/77 looks much better than in the 150s.  She is tolerating the losartan  and the amlodipine well.  We discussed that I would really like to see her blood pressure in the 120s and if we can do that we can keep the losartan  but start working on discontinuing the amlodipine if over the next couple of months she is doing really well with dietary and exercise changes to reduce her pressures.  We had discussed the DASH diet previously.  Though she is on the amlodipine also for Raynaud's.

## 2024-01-29 NOTE — Progress Notes (Signed)
 Pt reports that she just noticed the message about increasing the Losartan  to 50 mg on her way to her appointment this morning.   She has only been taking the 25 mg. She stated that she doesn't feel anything like she did when she came in 2 weeks ago.

## 2024-02-12 DIAGNOSIS — I10 Essential (primary) hypertension: Secondary | ICD-10-CM | POA: Diagnosis not present

## 2024-02-12 DIAGNOSIS — R0789 Other chest pain: Secondary | ICD-10-CM | POA: Diagnosis not present

## 2024-02-21 ENCOUNTER — Encounter: Payer: Self-pay | Admitting: Family Medicine

## 2024-02-21 DIAGNOSIS — I10 Essential (primary) hypertension: Secondary | ICD-10-CM

## 2024-02-23 NOTE — Telephone Encounter (Signed)
 Amlodipine  rx written by historical provider. Rx pended.

## 2024-02-24 MED ORDER — AMLODIPINE BESYLATE 5 MG PO TABS: 5.0000 mg | ORAL_TABLET | Freq: Every day | ORAL | 1 refills | Status: DC

## 2024-03-15 ENCOUNTER — Other Ambulatory Visit: Payer: Self-pay | Admitting: Family Medicine

## 2024-03-15 DIAGNOSIS — B009 Herpesviral infection, unspecified: Secondary | ICD-10-CM

## 2024-03-26 ENCOUNTER — Other Ambulatory Visit: Payer: Self-pay

## 2024-03-26 DIAGNOSIS — G43009 Migraine without aura, not intractable, without status migrainosus: Secondary | ICD-10-CM

## 2024-03-26 MED ORDER — SUMATRIPTAN SUCCINATE 100 MG PO TABS: ORAL_TABLET | ORAL | 5 refills | Status: AC

## 2024-03-26 NOTE — Telephone Encounter (Signed)
 Requesting rx rf of sumatriptan  100mg   Last written 02/12/2023 Dr. Bevin Last OV 01/29/2024 Upcoming appt 06/21/2024

## 2024-04-21 ENCOUNTER — Other Ambulatory Visit: Payer: Self-pay | Admitting: Family Medicine

## 2024-04-21 DIAGNOSIS — B009 Herpesviral infection, unspecified: Secondary | ICD-10-CM

## 2024-06-04 ENCOUNTER — Other Ambulatory Visit: Payer: Self-pay | Admitting: Obstetrics and Gynecology

## 2024-06-04 DIAGNOSIS — Z01419 Encounter for gynecological examination (general) (routine) without abnormal findings: Secondary | ICD-10-CM

## 2024-06-21 ENCOUNTER — Ambulatory Visit: Admitting: Family Medicine

## 2024-07-27 ENCOUNTER — Ambulatory Visit: Admitting: Family Medicine

## 2024-07-27 VITALS — BP 131/76 | HR 73 | Ht 62.0 in | Wt 189.0 lb

## 2024-07-27 DIAGNOSIS — I10 Essential (primary) hypertension: Secondary | ICD-10-CM

## 2024-07-27 NOTE — Progress Notes (Signed)
 "  Established Patient Office Visit  Patient ID: Shirley Ruiz, female    DOB: 1985/11/06  Age: 38 y.o. MRN: [id number] PCP: Alvan Dorothyann BIRCH, MD  Chief Complaint  Patient presents with   Hypertension    She reports that last week she had an episode when her bp was elevated 170/100    Subjective:     HPI  Discussed the use of AI scribe software for clinical note transcription with the patient, who gave verbal consent to proceed.  History of Present Illness Shirley Ruiz is a 38 year old female with hypertension who presents with a recent episode of elevated blood pressure.  Hypertensive episode - Recent episode of elevated blood pressure during a stressful work interaction, with initial readings of 170/100 mmHg escalating to 199/119 mmHg - Blood pressure decreased to 145/86 mmHg the following morning - Usual blood pressure in the high 120s/80s range - Symptoms during episode included feeling 'a little off' and experiencing blurry vision - No chest pain or shortness of breath during the episode  Antihypertensive medication use - Currently taking losartan , usually in the evening - During the hypertensive episode, took an additional dose earlier in the day and another dose in the evening - Carries medications to ensure adherence  Nonsteroidal anti-inflammatory drug (nsaid) use - Occasionally takes ibuprofen or Aleve, possibly took one the night before the hypertensive episode - Suspects NSAID use may affect blood pressure  Dietary and lifestyle factors - Denies unusual dietary intake, high sodium foods, or use of decongestants or excessive caffeine prior to the episode - Typically consumes one cup of coffee in the morning and occasionally an energy drink in the afternoon - Follows a low-sodium diet, avoids fast food, does not add salt to pre-made meals, uses salt sparingly when cooking, and prefers lower sodium options  Hip pain - History of hip pain with previous x-rays  showing degenerative joint changes - Pain triggered by certain movements, causing significant discomfort while walking - Occasionally uses ibuprofen for pain management   Flowsheet Row Office Visit from 07/27/2024 in [facility] at [facility]  PHQ-9 Total Score 5       ROS    Objective:     BP 131/76   Pulse 73   Ht 5' 2 (1.575 m)   Wt 189 lb (85.7 kg)   SpO2 100%   BMI 34.57 kg/m    Physical Exam Vitals and nursing note reviewed.  Constitutional:      Appearance: Normal appearance.  HENT:     Head: Normocephalic and atraumatic.  Eyes:     Conjunctiva/sclera: Conjunctivae normal.  Cardiovascular:     Rate and Rhythm: Normal rate and regular rhythm.  Pulmonary:     Effort: Pulmonary effort is normal.     Breath sounds: Normal breath sounds.  Skin:    General: Skin is warm and dry.  Neurological:     Mental Status: She is alert.  Psychiatric:        Mood and Affect: Mood normal.      No results found for any visits on 07/27/24.    The 10-year ASCVD risk score (Arnett DK, et al., 2019) is: 0.4%    Assessment & Plan:   Problem List Items Addressed This Visit       Cardiovascular and Mediastinum   Essential hypertension - Primary   Essential hypertension Recent BP spike to 199/119 mmHg, likely stress and NSAID-related. BP normalized next day. Losartan  25  mg generally effective, with occasional need for additional dose. - Continue losartan  25 mg daily. - Take additional losartan  dose during BP spikes. - Monitor BP post-NSAID use. - Encouraged low sodium diet, consider salt substitutes.      Relevant Orders   CMP14+EGFR    Assessment and Plan Assessment & Plan  Osteoarthritis of hip Chronic hip pain with degenerative changes, exacerbated by movement. NSAIDs used for pain, may elevate BP. - Monitor BP post-NSAID use. - Consider alternative pain management if NSAIDs elevate BP.  General health  maintenance Discussed flu vaccination and mammogram. She declined flu shot due to past adverse effects, mammogram not scheduled. - Schedule screening mammogram. - Consider flu vaccination later if desired.    Return in about 6 months (around 01/25/2025) for Hypertension.    Dorothyann Byars, MD [facility] at [facility]   "

## 2024-07-27 NOTE — Assessment & Plan Note (Signed)
 Essential hypertension Recent BP spike to 199/119 mmHg, likely stress and NSAID-related. BP normalized next day. Losartan  25 mg generally effective, with occasional need for additional dose. - Continue losartan  25 mg daily. - Take additional losartan  dose during BP spikes. - Monitor BP post-NSAID use. - Encouraged low sodium diet, consider salt substitutes.

## 2024-07-28 LAB — CMP14+EGFR
ALT: 20 IU/L (ref 0–32)
AST: 20 IU/L (ref 0–40)
Albumin: 4.4 g/dL (ref 3.9–4.9)
Alkaline Phosphatase: 58 IU/L (ref 41–116)
BUN/Creatinine Ratio: 21 (ref 9–23)
BUN: 16 mg/dL (ref 6–24)
Bilirubin Total: 0.3 mg/dL (ref 0.0–1.2)
CO2: 22 mmol/L (ref 20–29)
Calcium: 9.5 mg/dL (ref 8.7–10.2)
Chloride: 99 mmol/L (ref 96–106)
Creatinine, Ser: 0.77 mg/dL (ref 0.57–1.00)
Globulin, Total: 3.2 g/dL (ref 1.5–4.5)
Glucose: 95 mg/dL (ref 70–99)
Potassium: 4.5 mmol/L (ref 3.5–5.2)
Sodium: 136 mmol/L (ref 134–144)
Total Protein: 7.6 g/dL (ref 6.0–8.5)
eGFR: 100 mL/min/1.73

## 2024-08-25 ENCOUNTER — Other Ambulatory Visit: Payer: Self-pay | Admitting: Family Medicine

## 2024-08-25 DIAGNOSIS — I10 Essential (primary) hypertension: Secondary | ICD-10-CM

## 2024-12-08 ENCOUNTER — Encounter

## 2024-12-08 DIAGNOSIS — Z1231 Encounter for screening mammogram for malignant neoplasm of breast: Secondary | ICD-10-CM

## 2025-01-25 ENCOUNTER — Ambulatory Visit: Admitting: Family Medicine
